# Patient Record
Sex: Female | Born: 1935 | Race: White | Hispanic: No | Smoking: Former smoker
Health system: Southern US, Community
[De-identification: ages and names within clinical notes are randomized; demographics above are authoritative.]

## PROBLEM LIST (undated history)

## (undated) DIAGNOSIS — I350 Nonrheumatic aortic (valve) stenosis: Secondary | ICD-10-CM

## (undated) DIAGNOSIS — I442 Atrioventricular block, complete: Secondary | ICD-10-CM

## (undated) DIAGNOSIS — J40 Bronchitis, not specified as acute or chronic: Secondary | ICD-10-CM

## (undated) DIAGNOSIS — E785 Hyperlipidemia, unspecified: Secondary | ICD-10-CM

## (undated) DIAGNOSIS — I10 Essential (primary) hypertension: Secondary | ICD-10-CM

## (undated) DIAGNOSIS — I2510 Atherosclerotic heart disease of native coronary artery without angina pectoris: Secondary | ICD-10-CM

## (undated) HISTORY — DX: Atherosclerotic heart disease of native coronary artery without angina pectoris: I25.10

## (undated) HISTORY — DX: Atrioventricular block, complete: I44.2

## (undated) HISTORY — DX: Hyperlipidemia, unspecified: E78.5

## (undated) HISTORY — DX: Nonrheumatic aortic (valve) stenosis: I35.0

---

## 2000-01-06 ENCOUNTER — Inpatient Hospital Stay (HOSPITAL_COMMUNITY)
Admission: RE | Admit: 2000-01-06 | Discharge: 2000-01-14 | Payer: Self-pay | Admitting: Physical Medicine & Rehabilitation

## 2003-06-09 ENCOUNTER — Encounter: Payer: Self-pay | Admitting: Orthopedic Surgery

## 2003-12-30 ENCOUNTER — Inpatient Hospital Stay (HOSPITAL_COMMUNITY): Admission: EM | Admit: 2003-12-30 | Discharge: 2004-01-21 | Payer: Self-pay | Admitting: Emergency Medicine

## 2004-01-05 ENCOUNTER — Encounter (INDEPENDENT_AMBULATORY_CARE_PROVIDER_SITE_OTHER): Payer: Self-pay | Admitting: Specialist

## 2004-01-05 HISTORY — PX: CORONARY ARTERY BYPASS GRAFT: SHX141

## 2004-01-05 HISTORY — PX: AORTIC VALVE REPLACEMENT: SHX41

## 2004-01-08 ENCOUNTER — Encounter: Payer: Self-pay | Admitting: Thoracic Surgery (Cardiothoracic Vascular Surgery)

## 2004-01-11 HISTORY — PX: PERMANENT PACEMAKER INSERTION: SHX6023

## 2004-01-16 HISTORY — PX: STERNAL WOUND DEBRIDEMENT: SHX1058

## 2004-01-31 ENCOUNTER — Encounter
Admission: RE | Admit: 2004-01-31 | Discharge: 2004-01-31 | Payer: Self-pay | Admitting: Thoracic Surgery (Cardiothoracic Vascular Surgery)

## 2004-03-04 ENCOUNTER — Encounter
Admission: RE | Admit: 2004-03-04 | Discharge: 2004-03-04 | Payer: Self-pay | Admitting: Thoracic Surgery (Cardiothoracic Vascular Surgery)

## 2004-06-17 ENCOUNTER — Ambulatory Visit: Payer: Self-pay | Admitting: Orthopedic Surgery

## 2004-08-12 ENCOUNTER — Ambulatory Visit: Payer: Self-pay | Admitting: Internal Medicine

## 2004-08-16 ENCOUNTER — Inpatient Hospital Stay (HOSPITAL_COMMUNITY): Admission: EM | Admit: 2004-08-16 | Discharge: 2004-08-20 | Payer: Self-pay | Admitting: Emergency Medicine

## 2004-08-16 ENCOUNTER — Ambulatory Visit (HOSPITAL_COMMUNITY): Admission: RE | Admit: 2004-08-16 | Discharge: 2004-08-16 | Payer: Self-pay | Admitting: Pulmonary Disease

## 2004-09-09 ENCOUNTER — Encounter
Admission: RE | Admit: 2004-09-09 | Discharge: 2004-09-09 | Payer: Self-pay | Admitting: Thoracic Surgery (Cardiothoracic Vascular Surgery)

## 2005-04-29 ENCOUNTER — Ambulatory Visit (HOSPITAL_COMMUNITY): Admission: RE | Admit: 2005-04-29 | Discharge: 2005-04-29 | Payer: Self-pay | Admitting: *Deleted

## 2005-04-29 ENCOUNTER — Ambulatory Visit (HOSPITAL_COMMUNITY): Admission: RE | Admit: 2005-04-29 | Discharge: 2005-04-29 | Payer: Self-pay | Admitting: Pulmonary Disease

## 2006-09-24 ENCOUNTER — Ambulatory Visit (HOSPITAL_COMMUNITY): Admission: RE | Admit: 2006-09-24 | Discharge: 2006-09-24 | Payer: Self-pay | Admitting: Pulmonary Disease

## 2007-04-05 ENCOUNTER — Ambulatory Visit (HOSPITAL_COMMUNITY): Admission: RE | Admit: 2007-04-05 | Discharge: 2007-04-05 | Payer: Self-pay | Admitting: Cardiovascular Disease

## 2007-04-09 ENCOUNTER — Ambulatory Visit (HOSPITAL_COMMUNITY): Admission: RE | Admit: 2007-04-09 | Discharge: 2007-04-09 | Payer: Self-pay | Admitting: *Deleted

## 2007-08-03 ENCOUNTER — Encounter: Payer: Self-pay | Admitting: Orthopedic Surgery

## 2007-08-03 ENCOUNTER — Ambulatory Visit (HOSPITAL_COMMUNITY): Admission: RE | Admit: 2007-08-03 | Discharge: 2007-08-03 | Payer: Self-pay | Admitting: Pulmonary Disease

## 2008-03-31 ENCOUNTER — Ambulatory Visit (HOSPITAL_COMMUNITY): Admission: RE | Admit: 2008-03-31 | Discharge: 2008-03-31 | Payer: Self-pay | Admitting: Cardiovascular Disease

## 2008-08-28 ENCOUNTER — Ambulatory Visit (HOSPITAL_COMMUNITY): Admission: RE | Admit: 2008-08-28 | Discharge: 2008-08-28 | Payer: Self-pay | Admitting: Pulmonary Disease

## 2008-08-28 ENCOUNTER — Ambulatory Visit: Payer: Self-pay | Admitting: Orthopedic Surgery

## 2008-08-28 DIAGNOSIS — S62109A Fracture of unspecified carpal bone, unspecified wrist, initial encounter for closed fracture: Secondary | ICD-10-CM | POA: Insufficient documentation

## 2008-08-28 DIAGNOSIS — S52539A Colles' fracture of unspecified radius, initial encounter for closed fracture: Secondary | ICD-10-CM | POA: Insufficient documentation

## 2008-08-30 ENCOUNTER — Encounter: Payer: Self-pay | Admitting: Orthopedic Surgery

## 2008-08-30 ENCOUNTER — Encounter (HOSPITAL_COMMUNITY): Admission: RE | Admit: 2008-08-30 | Discharge: 2008-08-30 | Payer: Self-pay | Admitting: Orthopedic Surgery

## 2008-09-06 ENCOUNTER — Encounter: Payer: Self-pay | Admitting: Orthopedic Surgery

## 2008-09-28 ENCOUNTER — Ambulatory Visit: Payer: Self-pay | Admitting: Orthopedic Surgery

## 2008-10-04 ENCOUNTER — Encounter: Payer: Self-pay | Admitting: Orthopedic Surgery

## 2008-10-26 ENCOUNTER — Ambulatory Visit: Payer: Self-pay | Admitting: Orthopedic Surgery

## 2008-11-15 ENCOUNTER — Ambulatory Visit: Payer: Self-pay | Admitting: Orthopedic Surgery

## 2008-12-06 ENCOUNTER — Ambulatory Visit: Payer: Self-pay | Admitting: Orthopedic Surgery

## 2009-07-11 ENCOUNTER — Ambulatory Visit (HOSPITAL_COMMUNITY): Admission: RE | Admit: 2009-07-11 | Discharge: 2009-07-11 | Payer: Self-pay | Admitting: Pulmonary Disease

## 2009-09-25 ENCOUNTER — Encounter (HOSPITAL_COMMUNITY): Admission: RE | Admit: 2009-09-25 | Discharge: 2009-10-25 | Payer: Self-pay | Admitting: Pulmonary Disease

## 2009-10-15 ENCOUNTER — Ambulatory Visit (HOSPITAL_COMMUNITY): Admission: RE | Admit: 2009-10-15 | Discharge: 2009-10-15 | Payer: Self-pay | Admitting: Cardiovascular Disease

## 2010-01-18 ENCOUNTER — Encounter: Payer: Self-pay | Admitting: Orthopedic Surgery

## 2010-06-16 ENCOUNTER — Encounter: Payer: Self-pay | Admitting: Cardiovascular Disease

## 2010-06-16 ENCOUNTER — Encounter: Payer: Self-pay | Admitting: Otolaryngology

## 2010-06-28 NOTE — Letter (Signed)
Summary: Medical record request [employer] Information  Medical record request [employer] Information   Imported By: Cammie Sickle 01/18/2010 09:51:26  _____________________________________________________________________  External Attachment:    Type:   Image     Comment:   External Document

## 2010-09-26 ENCOUNTER — Other Ambulatory Visit (HOSPITAL_COMMUNITY): Payer: Self-pay | Admitting: Cardiovascular Disease

## 2010-09-26 DIAGNOSIS — I719 Aortic aneurysm of unspecified site, without rupture: Secondary | ICD-10-CM

## 2010-10-11 NOTE — Discharge Summary (Signed)
NAMEAMIAYA, MCNEELEY                              ACCOUNT NO.:  [id number]   MEDICAL RECORD NO.:  [id number]                   PATIENT TYPE:  INP   LOCATION:  2019                                 FACILITY:  [facility]   PHYSICIAN:  Salvatore Decent. Cornelius Moras, M.D.              DATE OF BIRTH:  04-13-1932   DATE OF ADMISSION:  DATE OF DISCHARGE:  01/22/2004                                 DISCHARGE SUMMARY   ADMITTING DIAGNOSES:  Critical aortic valve stenosis.   PAST MEDICAL HISTORY:  1. Hypertension.  2. Gastroesophageal reflux disease.  3. Osteoporosis.  4. Degenerative joint disease.  5. Arthritis.   SURGICAL HISTORY:  1. Right total hip replacement.  2. Right total knee replacement.  3. Repair of fracture and displaced right clavicle many years ago.   ALLERGIES:  None.   DISCHARGE DIAGNOSES:  1. Critical aortic stenosis and single-vessel coronary artery disease,     status post coronary artery bypass graft and aortic-valve replacement.  2. Left bundle branch block with postoperative persistent complete heart     block, status post permanent pacemaker insertion.  3. Sternal wound dehiscence, status post sternal debridement ad rewiring.  4. Generalized deconditioning, improving.   BRIEF HISTORY:  Ms. Lisle is a 77 year old female.  She was physically active  and functionally independent until a few months prior to this admission.  She had intermittent mild problems with congestion, but then she developed  intermittent symptoms of atypical chest pain.  Her symptoms progressed, and  she ultimately presented to the emergency department at [facility]  on December 30, 2003.  She was admitted for presumed evaluation of possible  angina with atypical chest pain.  On exam, she was noted to have congestive  heart failure.  Cardiology consultation was obtained on August 8 by Dr.  Domingo Sep of [facility].  On physical  examination, the patient was noted to have a  prominent murmur suggestive of  aortic stenosis.  Brain natruretic peptide level was severely elevated at  greater than 1200.  She had ruled out for acute myocardial infarction.  A 2-  D echocardiogram performed at [facility] revealed critical aortic  valve stenosis.  Ejection fraction was estimated to be 35-40%.  Ms. Torpey was  subsequently transferred to [facility]  under the care of  [facility].   HOSPITAL COURSE:  On January 03, 2004, Ms. Sandusky was transferred to [facility] under the care of Dr. Domingo Sep.  She underwent right and left heart  cardiac catheterized by Dr. Tresa Endo.  Findings included critical aortic-valve  stenosis with a mean transvalvular gradient of 80 mmHg and an estimated  aortic valve area of 0.3 sq. cm, mild pulmonary hypertension.  She was also  noted to have 50% stenosis of the mid LAD coronary artery.  Cardiac surgery  consultation was requested,  and she was evaluated later in the day by Dr.  Purcell Nails.  After examination of the patient and review of all  available records, Dr. Cornelius Moras recommended proceeding with aortic valve  replacement and single-vessel coronary artery bypass grafting.  The  procedures, the risks and benefits were all discussed with Ms. Twiggs and her  family, and Ms. Nedra Hai agreed to proceed with surgery.  A discussion included  use of mechanical versus bioprosthetic prosthesis for her aortic valve.  After considerable discussion, Ms. Sardo specifically requested the use of a  tissue valve so that she would not require long-term anticoagulation with  Coumadin.   Preoperative arterial evaluation included a carotid Duplex exam that  revealed no significant carotid artery disease.  Her lower-extremity  evaluation ABI's are greater than 1.0 bilaterally.   On January 05, 2004, Ms. Thurow underwent the following surgical procedures with  Dr. Salvatore Decent. Owen:  1. Aortic valve replacement with a 19 mm  Carpentier-Edwards pericardial     prosthesis.  2. Coronary artery bypass grafting x1, left internal mammary artery was     grafted to the left anterior descending artery.  Intraoperative findings     were notable for a bicuspid, native aortic valve with critical aortic     stenosis.  The maximum diameter of the ascending aorta was 3.7 cm.  She     had moderate-to-severe LV function.  Also had a very-large left     diaphragmatic hernia which was not repaired.  Also notes that she had a     transient episode of third-degree AV block immediately prior to surgery.     Ms. Druck tolerated these procedures reasonably well, transferring in     stable condition to the SICU.  She remained hemodynamically stable in the     immediate postoperative period.  She was extubated several hours after     arrival in the intensive care unit.  She awoke from anesthesia     neurologically intact.  She did require AV pacing postoperatively due to     her persistent heart block.  Permanent pacemaker insertion was     anticipated.  Ms. Leisey was ready for transfer to Unit 2000 on August 15.     Her overall recovery was slow, but steady.  Plans were made for a     permanent pacemaker insertion, and she was scheduled for January 11, 2004.   On that day, Dr. Pearletha Furl. Alanda Amass implanted a permanent DDR pacesetter,  Identity XL DR [address] Generator, Model #5376, [employer]  Serial [phone] with steroid eluting tined atrial J passive fixation and  steroid eluting tined ventricular electrode, both bipolar IS-1 connectors.  She tolerated this procedure well, transferring back to her room in stable  condition.   Over the next several days, Ms. Ohagan continued to make slow but steady  progress in her recovery.  She did continue to have some volume overload  treated with aggressive pulmonary toilet as well as some Lasix.  Case  management had been following her progress, and arrangements were made for Home  Health registered nurse, physical therapy and home aid.  She did  continue to require O2 supplementation as her saturations decreased into the  mid 80's with ambulation.   On August 24, on morning rounds, examination revealed her sternum with  marked instability.  There were audible and palpable clicks with each  breath.  There was no erythema or drainage noted.  Chest x-ray  was taken and  revealed that her sternal wires may have pulled through the bone at some  point.  A CT scan was performed and confirmed sternal fracture in multiple  locations and complete non-union, plus some small, associated pockets of  fluid.  Dr. Cornelius Moras reviewed the chest x-ray and the CT scan and discussed  options for proceeding with Ms. Rennert and her family.  He recommended sternal  rewiring versus continued observation.  Ms. Cressler elected to proceed with  sternal rewiring.   On August 23, Ms. Cherney underwent the following surgical procedure with Dr.  Cornelius Moras:  Sternal debridement and rewiring.  Findings at surgery are notable  for multiple fractures on each side of the sternal, body and manubrium.  There was no sign of infection.  She tolerated the procedure well,  transferring back to the SICU in stable condition.  She was extubated  immediately following surgery and awoke from anesthesia neurologically  intact.  She remained hemodynamically stable in the immediate postoperative  period.  She was returned to the SICU immediately postoperatively, and was  ready for discharge back to Unit 2000 on August 24.   Overall, Ms. Morash is continuing to make progress following her surgeries.  Her chest tubes were removed on August 25.  On August 26, Ms. Charlet seems to  be making progress.  Her incision is healing.  Her chest x-ray is improving.  She is still requiring O2 via nasal cannula.  She continues to require some  diuresis, and a pulmonary toilet.  If she continues to progress as  anticipated, she will be ready for discharge  home by August 29th.  Sutures  will stay in place until she is seen back in the office by Dr. Cornelius Moras.   CONDITION ON DISCHARGE:  Improved.   DISCHARGE MEDICATIONS:  1. Aspirin 325 mg daily.  2. Lopressor 50 mg b.i.d.  3. Altace 2.5 mg daily.  4. Aciphex 20 mg daily.  5. Iron complex 150 mg b.i.d.  6. Folic acid 2 mg daily.  7. Lasix 40 mg daily for the next five days.  8. Potassium chloride 20 mEq daily for the next five days.  9. Os-Cal 500 mg plus D t.i.d.  10.      For pain management, she may have Tylox one to two p.o. q.4-6h     p.r.n.   DISCHARGE ACTIVITIES:  1. She has been asked to refrain from any driving or heavy lifting, pushing     or pulling.  2. Also instructed to continue breathing exercises and daily walking.  3. She is to work with Home Health physical therapist to regain her     strength.   DISCHARGE DIET:  Diet should continue to be carbohydrate-modified, medium  calorie diet.  WOUND CARE:  She may shower.  If her incisions show any sign of infection,  she is to call Dr. Orvan July office.   FOLLOWUP:  1. She is to call for an appointment with her cardiologist, Dr. Domingo Sep, in     approximately two weeks.  She has been given the phone number for that     office.  2. Dr. Cornelius Moras would like to see her back at the [facility] office on Wednesday,     September 7, at 4 p.m.  She will be asked to have a chest x-ray q. dc on     September 7 at 3 p.m.  She will bring that chest x-ray to Dr. Orvan July     office  for him to review.      Toribio Harbour, N.P.                  Salvatore Decent. Cornelius Moras, M.D.    CTK/MEDQ  D:  01/19/2004  T:  01/21/2004  Job:  [id number]   cc:   Salvatore Decent. Cornelius Moras, M.D.  [address]  [place]  [place] [address]   Dani Gobble, MD  Fax: [phone]   Oneal Deputy. Juanetta Gosling, M.D.  [address]  [place]  [place] [address]  Fax: [phone]

## 2010-10-11 NOTE — Group Therapy Note (Signed)
NAMEELISE, KNOBLOCH                    ACCOUNT NO.:  [id number]   MEDICAL RECORD NO.:  [id number]          PATIENT TYPE:  INP   LOCATION:  A329                          FACILITY:  [facility]   PHYSICIAN:  Edward L. Juanetta Gosling, M.D.DATE OF BIRTH:  1931-12-18   DATE OF PROCEDURE:  08/19/2004  DATE OF DISCHARGE:                                   PROGRESS NOTE   PROBLEM LIST:  1.  Hyponatremia.  2.  Hypertension.  3.  Status post valve replacement.  4.  Gastroesophageal reflux disease.   SUBJECTIVE:  Ms. Belding says she is feeling better.  Her blood pressure was up  last night.  I have restarted her on her antihypertensives.  She was also  very anxious worried about having an EGD done.   OBJECTIVE:  VITAL SIGNS:  Temperature 98.1, pulse 70, respirations 18, blood  pressure 184/94.  CHEST:  Clear.  HEART:  Regular.   LABORATORY DATA AND X-RAY FINDINGS:  Her lab work today shows a white count  of 9600, hemoglobin 13.7, platelets 215.  Sodium 128, potassium 3.3.  After  she has had her EGD, I will replace her potassium.  C. difficile is  negative.   ASSESSMENT:  She seems to be doing well.   PLAN:  EGD today.  She will probably be able to go home tomorrow.      ELH/MEDQ  D:  08/19/2004  T:  08/19/2004  Job:  [id number]

## 2010-10-11 NOTE — Discharge Summary (Signed)
[facility]  Patient:    [name], [name]                           MRN: [id number] Adm. Date:  [id number] Disc. Date: [id number] Attending:  Herold Harms Dictator:   Mcarthur Rossetti. Angiulli, P.A.                           Discharge Summary  DISCHARGE DIAGNOSES: 1. Right total knee arthroplasty on December 31, 1999. 2. Postoperative anemia. 3. Hyponatremia and hypokalemia, resolved. 4. History of right total hip replacement in 1998. 5. History of dislocated clavicle fracture.  HISTORY OF PRESENT ILLNESS:  A 77 year old female with history of a right total hip arthroplasty in 1998, of which she did receive inpatient rehabilitation services for who now presented with chronic progressive right knee pain, x-rays with advanced degenerative joint disease.  No relief with conservative care.  Underwent a right total knee arthroplasty on December 31, 1999, placed on subcutaneous Lovenox for deep venous thrombosis prophylaxis, non-weightbearing.  Postoperative anemia and transfused.  No chest pain or shortness of breath.  Noted hypotension secondary to narcotics which were adjusted.  Her hydrochlorothiazide was discontinued.  Hyponatremia 123 improved to 133, potassium 3.2, placed on supplement.  Ambulating 90 feet with standby assistance.  Latest hemoglobin 11.5, hematocrit 34.4.  Chest x-ray no active disease.  Admitted for comprehensive rehab program.  PAST MEDICAL HISTORY:  See discharge diagnoses.  PAST SURGICAL HISTORY: 1. Dislocated collar bone in 1975. 2. Right total hip arthroplasty.  PRIMARY CARE PHYSICIAN:  Kari Baars, M.D. of [place].  ORTHOPEDIST:  Dr. Romeo Apple.  ALLERGIES:  No known drug allergies.  MEDICATIONS PRIOR TO ADMISSION: 1. Zestoretic daily. 2. Calcitonin nasal spray daily.  SOCIAL HISTORY:  Lives with husband in [place].  Independent prior to admission. Retired.  One level home, three steps to entry.  Plans to stay with  her daughter in [place] on discharge.  That is a two level home with three steps to entry, sleeper sofa in downstairs.  Daughter with intermittent follow up.  HOSPITAL COURSE:  The patient did well while in rehabilitation services with therapies initiated on a b.i.d. basis.  The following issues were followed during patients rehab course:  Pertaining to Ms. Lees right total knee arthroplasty, it remained stable, surgical site healing nicely, staples had been removed, no signs of infection. She was touchdown weightbearing as advised per orthopedic services.  Her postoperative anemia was stable with latest hemoglobin of 10.7, hematocrit 33.6.  Her hyponatremia hypokalemia had improved.  Latest sodium 133 and potassium 10.5.  Blood pressures remained monitored on hydrochlorothiazide. Her lisinopril had been held since January 07, 2000, due to some hypotension. This was resumed at time of discharge.  She had no bowel or bladder disturbances.  Overall, for her functional mobility she was ambulating extended distances with a walker, household distances, non-weightbearing, needing very little assistance for activities of daily living.  She would receive home health therapies.  Latest labs showed a hemoglobin 10.7, hematocrit 33.6.  Sodium 133, BUN 10, creatinine 0.5.  She was discharged to home.  DISCHARGE MEDICATIONS: 1. OxyContin CR 10 mg q.12h. x 1 week. 2. Calcitonin nasal spray 1 puff daily. 3. Hydrochlorothiazide 12.5 mg daily. 4. Lisinopril 20 mg daily. 5. The patient was on Lovenox for deep venous thrombosis prophylaxis which was    discontinued at time of discharge. 6.  Oxycodone immediate release as needed pain.  ACTIVITY:  Non-weightbearing with walker.  DIET:  Regular.  WOUND CARE:  Cleanse incision daily with warm soap and water.  FOLLOWUP:  Dr. Romeo Apple orthopedic services, Dr. Shaune Pollack medical management. DD:  01/13/00 TD:  01/14/00 Job: [id number]

## 2010-10-11 NOTE — Consult Note (Signed)
[name], [name]                    ACCOUNT NO.:  [id number]   MEDICAL RECORD NO.:  [id number]          PATIENT TYPE:  INP   LOCATION:  A329                          FACILITY:  [facility]   PHYSICIAN:  Lionel December, M.D.    DATE OF BIRTH:  [birth year]   DATE OF CONSULTATION:  08/18/2004  DATE OF DISCHARGE:                                   CONSULTATION   REASON FOR CONSULTATION:  Chronic gastroesophageal reflux disease which is  poorly controlled.   HISTORY OF PRESENT ILLNESS:  [name] is a 77 year old Caucasian female who was  admitted to Dr. Juanetta Gosling' service yesterday with profound hyponatremia.  The  patient was recently treated for URI with Levaquin.  She presented to Dr.  Juanetta Gosling on August 16, 2004 with extreme weakness.  She also was complaining  of bloating and retrosternal burning and had a few loose stools.  She had  lab studies by him. Her WBC was 13.7.  Hemoglobin 14.2 hematocrit 43.5.  Platelet count was 248,000 and segs were 85%.  Serum sodium was 109,  potassium 4, chloride 71, glucose 144.  BUN 3, creatinine 0.6, calcium 8.9.  With this profound hyponatremia, the patient was advised to be hospitalized.  She has been treated with sodium chloride.  She has been on IV fluids with  normal saline and her hyponatremia has corrected to serum sodium of 129  today.  She is feeling some better.   The patient has had heartburn for seven years according to her daughter.  Her symptoms have not been well controlled.  According to Dr. Juanetta Gosling, she  has been on double dose PPI in the past.  She has frequent heartburn and  regurgitation particular when she is supine.  She has intermittent  hoarseness and cough.  She denies nausea, vomiting, dysphagia, melena, or  rectal bleeding.  For the last couple of days her stools have been normal.  She also denies abdominal pain.  She had cardiac surgery in August 2005 and  she just has not bounced back from that surgery.  She feels fatigued, weak  and  has not been able to do much physical activity.  She joined cardiac  rehab but dropped out quickly. She states she does walk some in her house.  She has never undergone EGD in the past.  While she is in the hospital, Dr.  Juanetta Gosling felt that she could undergo GI evaluation.   MEDICATIONS:  1.  Lovenox 40 mg subcutaneously q.12h.  2.  Flonase each nostril daily.  3.  Protonix 40 mg p.o. q.a.m.  4.  Rocephin 1 gram IV daily.  5.  She is on normal saline at a rate of 75 mg/hour.  6.  P.r.n. medications include Imodium, Zofran, and Tylenol.   At home she has been on aspirin, Lopressor, Altace, Aciphex, and folic acid.   PAST MEDICAL HISTORY:  1.  Hypertension.  2.  Osteoporosis.  She has been on Fosamax in the past, but presently on no      medications.  She says she does take Tums for heartburn  and to get an      extra dose of calcium.  3.  She had AVR and CABG in August 2005.  She had wound dehiscence or some      problem and had to have redo closure.  4.  She has had chronic GERD for 10 years or longer.  5.  She has history of osteoarthrosis.  6.  She has had right knee replacement as well as right hip replacement by      Dr. Romeo Apple a few years ago.  7.  She has had tonsillectomy.   ALLERGIES:  None known.   FAMILY HISTORY:  Significant for diabetes mellitus in one sister.   SOCIAL HISTORY:  She is a widow.  She is retired.  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  She quit cigarette smoking 30 years ago after having smoked  for five years.  She does not drink alcohol.  She lives alone.  She has five  children.   PHYSICAL EXAMINATION:  GENERAL:  Pleasant, well-developed, well-nourished  Caucasian female who was in no acute distress, 145 pounds.  She is 63 inches  tall.  VITAL SIGNS:  Pulse 86 per minute, blood pressure 150/79, respirations 18,  temperature 97.  HEENT: Conjunctivae pink. Sclerae nonicteric.  Oropharyngeal mucosa is  normal.  She has upper and lower dentures in  place.  NECK:  No neck masses or thyromegaly noted.  CARDIAC:  Regular rhythm, normal S1 and S2.  She also has a grade 2/6  systolic ejection murmur best heard in the aortic area.  LUNGS:  She has a few fine rales at the right base.  ABDOMEN:  Symmetrical.  Bowel sounds are normal.  Palpation reveals soft  abdomen without tenderness, organomegaly or masses.  RECTAL:  Deferred.  EXTREMITIES:  She does not have peripheral edema or clubbing.   LABORATORY DATA:  Preadmission labs from August 16, 2004 as above.  Chemistry  panel from August 17, 2004 showed sodium 113, potassium 4, chloride 81, CO2  27, glucose 79, BUN 3, bilirubin 0.8, AP 57, AST 22, ALT 19, total protein  5.1 with albumin 3.2, calcium 7.8.  Her serum sodium was 129 today.   ASSESSMENT:  1.  [name] is a 77 year old Caucasian female who is undergoing therapy for      hyponatremia which is unexplained.  2.  She has chronic gastroesophageal reflux disease with poorly controlled      symptoms but on proton pump inhibitor.  Her upper gastrointestinal tract needs to be evaluated to make sure she does  not have a large hernia or varus esophagus.  1.  She is at average risk for colorectal carcinoma.  She has never been      screened.  This was discussed with the patient and she may consider      having colonoscopy for screening purposes at her later date when      electrolyte issues have been resolved.   RECOMMENDATIONS:  1.  Diagnostic esophagogastroduodenoscopy to be performed in a.m.  She will      stay on single-dose PPI for now.  She will be SBE prophylaxis for this procedure.  1.  We will also check TSH and fasting cortisol levels   I would like to thank Dr. Juanetta Gosling for the opportunity to participate in the  care of this nice lady.      NR/MEDQ  D:  08/18/2004  T:  08/19/2004  Job:  [id number]

## 2010-10-11 NOTE — Procedures (Signed)
[name], [name]                              ACCOUNT NO.:  [id number]   MEDICAL RECORD NO.:  [id number]                   PATIENT TYPE:  INP   LOCATION:  A313                                 FACILITY:  [facility]   PHYSICIAN:  Dani Gobble, MD                    DATE OF BIRTH:  [birth year]   DATE OF PROCEDURE:  01/02/2004  DATE OF DISCHARGE:                                  ECHOCARDIOGRAM   REFERRING PHYSICIAN:  Dr. Juanetta Gosling and Dr. Domingo Sep.   INDICATIONS:  [name] is a 77 year old female with history of hypertension  who is admitted with chest pain and shortness of breath. She was found to  have a systolic murmur and was referred for echocardiogram today to evaluate  for the possibility of aortic stenosis. Technical quality of the study is  somewhat limited secondary to patient's body habitus and poor acoustic  windows.   The aorta is within normal limits at 3.1 cm.   The left atrium is mildly dilated at 4.2 cm. No obvious clots or masses were  appreciated, and the patient appeared to be in sinus rhythm during this  procedure. The intraventricular septum and posterior wall were moderately  thickened at 1.8 cm and 1.6 cm, respectively.   The aortic valve was heavily calcified with severely diminished leaflet  excursion. Peak velocity was 5.5 m per second corresponding to a peak  gradient at 121 mmHg and a mean gradient of 84 mgHg consistent with severe  aortic stenosis. Mild aortic insufficiency.   The mitral valve appeared structurally normal with mild mitral annular  calcification. Mild mitral regurgitation was noted. No mitral valve prolapse  was noted. Doppler interrogation of the mitral valve was within normal  limits.   The pulmonic valve was not well visualized.   The tricuspid valve appeared grossly structurally normal with tricuspid  regurgitation noted.   The left ventricle was normal in size with the LVIDD measured at 4.0 cm and  the LVISD measured at 3.1 cm. There  was mild global hypokinesis with a  severely hypokinetic to akinetic anterior septal wall. The overall ejection  fraction was diminished and estimated at 35 to 40%. The presence of  diastolic dysfunction was inferred from pulse wave Doppler across the mitral  valve. The right atrium and right ventricle were normal in size, and the  right ventricular systolic function appeared to be preserved. The intra-  atrial septum exhibited left to right bowing suggestive of elevated left  ventricular end diastolic pressure.   IMPRESSION:  1. Mild left atrial enlargement.  2. Moderate concentric left ventricular hypertrophy.  3. Severe aortic stenosis.  4. Mild mitral annular calcification.  5. Mild mitral regurgitation.  6. Trivial tricuspid regurgitation.  7. Intra-atrial septum exhibits left to right bowing suggestive of elevated     left ventricular end diastolic pressure.  8. Mild aortic insufficiency  is noted.  9. Normal left ventricular size with decreased ejection fraction estimated     at 35 to 40%. There is mild global hypokinesis with additional severely     hypokinetic to akinetic anterior septal wall.  10.      The presence of diastolic dysfunction is inferred from pulse wave     Doppler across the mitral valve.      ___________________________________________                                            Dani Gobble, MD   AB/MEDQ  D:  01/02/2004  T:  01/02/2004  Job:  [id number]

## 2010-10-11 NOTE — Op Note (Signed)
[name], May                              ACCOUNT NO.:  [id number]   MEDICAL RECORD NO.:  [id number]                   PATIENT TYPE:  INP   LOCATION:  2311                                 FACILITY:  [facility]   PHYSICIAN:  Judie Petit, M.D.              DATE OF BIRTH:  1931-07-21   DATE OF PROCEDURE:  01/05/2004  DATE OF DISCHARGE:                                 OPERATIVE REPORT   INDICATIONS FOR PROCEDURE:  Ms. Domek is a 77 year old female who presents  today for coronary artery bypass grafting and aortic valve replacement by  Dr. Cornelius Moras. A transesophageal echocardiogram will be utilized for evaluation  of cardiac structure and function.   The morning of surgery the patient was brought to the holding area where a  radial artery and pulmonary artery lines were placed under local anesthesia  with sedation. The patient was then taken to the operating room for routine  induction of anesthesia.  The trachea was intubated. Following intubation of  the trachea, the TE pole was passed oropharyngeally into the and withdrawn  for imaging of the cardiac structures. The TE probe was placed in a sleeve  and lubricated.   CARDIOPULMONARY BYPASS, TRANSESOPHAGEAL ECHOCARDIOGRAM:  Left ventricle:  The left ventricle was moderately concentrically thickened. There was mild  to moderate depression of left ventricular function, with hypokinesis of the  anteroseptal area.  Aortic valve: The aortic valve was heavily calcified with significant  decrease in leaflet excursion. There is trace to mild aortic insufficiency  by Doppler examination.  Mitral valve: The valve appeared to function appropriately. There was 1+  mitral regurgitant flow on Doppler examination. There was no reversal of  flow by pulse rate examination.  Left atrium: The left atrium was dilated.  The atrial appendage was clear of  any masses. The interapical septum appeared intact.  Right ventricle: Pulse within normal limits. The  tricuspid valve appeared to  function appropriately. There was tricuspid regurgitant flow on examination.   The patient was placed on cardiopulmonary bypass, coronary artery bypass  grafting, and aortic valve replacement was carried out. De-airing maneuvers  were subsequently carried out. The patient was re-warmed and separated from  coronary artery bypass graft with the initial attempt.   POST CABG TRANSESOPHAGEAL ECHOCARDIOGRAM, LIMITED EXAMINATION:  Left  ventricle--the chamber initially revealed evidence of low volume, but with  replacement of volume the contractile pattern improved.  Aortic valve--the aortic valve appeared to function appropriately. There did  not appear to be any obstruction to flow. There was no significant  regurgitant flow on Doppler examination.   The rest of the cardiac exam was as previously described. The patient was  returned to the cardiac intensive care unit in stable condition.  Judie Petit, M.D.    CE/MEDQ  D:  01/06/2004  T:  01/06/2004  Job:  [id number]

## 2010-10-11 NOTE — H&P (Signed)
NAMEJOSIE, [name]                              ACCOUNT NO.:  [id number]   MEDICAL RECORD NO.:  [id number]                   PATIENT TYPE:  INP   LOCATION:  A313                                 FACILITY:  [facility]   PHYSICIAN:  Melvyn Novas, MD        DATE OF BIRTH:  07-13-1931   DATE OF ADMISSION:  12/30/2003  DATE OF DISCHARGE:                                HISTORY & PHYSICAL   The patient is a 77 year old white female, widowed this year, patient of Dr.  Juanetta Gosling who was seen in the office one week ago and given Aciphex and also  an antibiotic for bronchitis and GERD like symptoms.  She, despite this  therapy listed previously, had increasing retrosternal burning radiating to  her neck and through to her back.  This was also associated with increasing  dyspnea on exertion over the previous week.  She specifically denies anginal  chest pain, orthopnea or PND, cough or sputum.  However, she is noted to  have a left bundle branch block pattern on EKG with no old EKG to compare it  to despite looking through her old records.  She is subsequently admitted  for GERD like symptoms with a question of an anginal component in the face  of a left bundle.  She will be placed on antiplatelet therapy, sublingual  nitroglycerin and a Cardiolite scan scheduled for Monday.   PAST MEDICAL HISTORY:  1. GERD.  2. History of hypertension, off current therapy.  3. Questionable reactive depression due to being widowed this year.   PAST SURGICAL HISTORY:  1. Right hip arthroplasty.  2. Right knee arthroplasty.  3. Fractured displaced right clavicle many years ago.   SOCIAL HISTORY:  She lives alone and has four children who are supportive.   CURRENT MEDICATIONS:  1. Aciphex 20 mg per day.  2. Phenergan 25 p.r.n.  3. Keflex 500 t.i.d.   PHYSICAL EXAMINATION:  VITAL SIGNS:  Blood pressure is 132/74; pulse is 91  and regular; respiratory rate is 18; 95% sat; temperature is 97.1.  EYES:   PERRLA.  Extraocular movements are intact.  Sclerae clear.  Conjunctivae pink.  NECK:  No jugulovenous distension.  No carotid bruits.  No thyromegaly or  thyroid bruits.  LUNGS:  Clear to A&P.  No rales, wheezes or rhonchi.  HEART:  Regular rhythm.  No murmurs, gallops, heaves, thrills or rubs.  No  S3, S4 is appreciable.  ABDOMEN:  Soft, nontender.  Bowel sounds normoactive.  No guarding, rebound,  no masses, no organomegaly.  EXTREMITIES:  No clubbing, cyanosis or edema.  NEUROLOGIC:  Cranial nerves II-XII are grossly intact.  The patient moves  all four extremities.   IMPRESSION:  1. Chest pain, gastroesophageal reflux disease like symptoms.  2. New left bundle branch block pattern per EKG.  3. History of hypertension; lipid status unknown.  4. Recently widowed.   PLAN:  Admit, get cardiac  enzymes daily times two, schedule Cardiolite scan  for Monday, aspirin free, antiplatelet therapy, sublingual nitroglycerin  p.r.n., continue Aciphex 20 mg per day and I will make further  recommendations as the data base expands.     ___________________________________________                                         Melvyn Novas, MD   RMD/MEDQ  D:  12/30/2003  T:  12/30/2003  Job:  [id number]

## 2010-10-11 NOTE — Discharge Summary (Signed)
NAMEDONJA, TIPPING                    ACCOUNT NO.:  [id number]   MEDICAL RECORD NO.:  [id number]          PATIENT TYPE:  INP   LOCATION:  A329                          FACILITY:  [facility]   PHYSICIAN:  Edward L. Juanetta Gosling, M.D.DATE OF BIRTH:  1932/04/10   DATE OF ADMISSION:  08/16/2004  DATE OF DISCHARGE:  03/28/2006LH                                 DISCHARGE SUMMARY   FINAL DISCHARGE DIAGNOSES:  1.  Hyponatremia.  2.  Gastroesophageal reflux disease and peptic ulcer disease.  3.  Hypertension.  4.  Status post aortic valve replacement.  5.  Osteoporosis.  6.  Degenerative joint disease.  7.  Coronary artery occlusive disease.   HISTORY OF PRESENT ILLNESS:  Ms. Latka is a 77 year old with complicated  medical history over the last six months or so.  She has had an aortic valve  replacement and coronary artery bypass grafting.  She had been doing fairly  well, making slow progress, and when she developed what seemed to be an  upper respiratory infection which is about a third or fourth of the last  several weeks.  She came to my office on the day of admission, had a chest x-  ray done which really was pretty much nonrevealing.  Had blood work done  which showed that she was intensely hyponatremic.   PHYSICAL EXAMINATION:  GENERAL APPEARANCE:  Well-developed, well-nourished  female in no acute distress but weak and tired.  VITAL SIGNS:  Temperature 97.2, pulse 74, respirations 20, blood pressure  161/93.  HEENT:  Pupils were reactive.  Nose and Throat clear.  NECK:  Supple without masses.  CHEST:  Fairly clear.  Midline sternotomy scar was seen.   LABORATORY DATA:  White count was 13,700, sodium 109.   ASSESSMENT:  At the time of admission, she had hyponatremia, probably  because she had had significant problems with reflux.  Had taken in fluids  at the expense of taking in solid food.  Sodium came up rather quickly.  She  was evaluated by the GI team, had an EGD which showed  gastritis,  esophagitis, a patch of what may be gastric mucosa in the esophagus and  evidence of an old ulcer.  She was treated with Protonix 40 mg b.i.d. and  has improved.  At the time of discharge, she is much improved, and she is  discharged home off of her previous Aciphex.   DISCHARGE MEDICATIONS:  1.  Protonix 40 mg b.i.d.  2.  Lopressor 50 mg b.i.d.  3.  Altace 10 mg daily.  4.  Folic acid 2 mg daily.  5.  Reglan 10 mg a.c. and h.s.   DISCHARGE INSTRUCTIONS:  She is going to liberalize her diet to have more  salt, and she will have home health services.      ELH/MEDQ  D:  08/20/2004  T:  08/20/2004  Job:  [id number]

## 2010-10-11 NOTE — Group Therapy Note (Signed)
NAMEDORENE, BRUNI                    ACCOUNT NO.:  [id number]   MEDICAL RECORD NO.:  [id number]          PATIENT TYPE:  INP   LOCATION:  A329                          FACILITY:  [facility]   PHYSICIAN:  Edward L. Juanetta Gosling, M.D.DATE OF BIRTH:  Dec 07, 1931   DATE OF PROCEDURE:  08/17/2004  DATE OF DISCHARGE:                                   PROGRESS NOTE   PROBLEM:  1.  Hyponatremia.  2.  Upper respiratory infection.   SUBJECTIVE:  Ms. Reiger says she is feeling better.  She has no new complaints  except for a headache.  Her reflux is better.   PHYSICAL EXAMINATION:  VITAL SIGNS:  Temperature is 97.9, pulse 69,  respirations 20, blood pressure 123/72.  CHEST:  Much clearer.  HEART:  Regular.   LABORATORY DATA:  Her sodium has come up, but only to 113, however, that is  in less than 24 hours.  Glucose is 79, BUN 3, creatinine 0.8.  Her liver  function tests are normal.  Albumin is 3.2.   ASSESSMENT:  She and I have discussed all of this at length, and I think the  biggest problem is that she is still not totally recovered from her previous  surgery for her heart, and I think she has had a significant weakness, etc.,  from that.      ELH/MEDQ  D:  08/17/2004  T:  08/18/2004  Job:  [id number]

## 2010-10-11 NOTE — Op Note (Signed)
[name], [name]                              ACCOUNT NO.:  [id number]   MEDICAL RECORD NO.:  [id number]                   PATIENT TYPE:  INP   LOCATION:  2304                                 FACILITY:  [facility]   PHYSICIAN:  Salvatore Decent. Cornelius Moras, M.D.              DATE OF BIRTH:  1932/02/15   DATE OF PROCEDURE:  01/16/2004  DATE OF DISCHARGE:                                 OPERATIVE REPORT   PREOPERATIVE DIAGNOSIS:  Sternal dehiscence.   POSTOPERATIVE DIAGNOSIS:  Sternal dehiscence.   PROCEDURE:  Excisional sternal debridement and sternal rewiring.   SURGEON:  Salvatore Decent. Cornelius Moras, M.D.   ASSISTANT:  Ms. Max Sane.   ANESTHESIA:  General.   BRIEF CLINICAL NOTE:  The patient is a 77 year old female who underwent  aortic valve replacement and coronary artery bypass grafting x1 on August  12.  Her initial postoperative course was notable for some persistent  complete heart block, which was also notably present transiently  preoperatively.  The patient otherwise slowly improved without significant  sequelae.  She ultimately underwent placement of a permanent pacemaker.  This was performed on August 18.  Chest x-ray performed on August 19  revealed that all of the sternal wires originally used to close her  sternotomy at the time of her original heart surgery appear to have moved or  pulled through.  Over the ensuing several days the patient developed  increased pain as well as obvious physical signs of sternal instability.  A  chest CT scan confirmed the presence of sternal dehiscence.  The patient  remained entirely afebrile with normal white blood count and no sign of  infection whatsoever on physical exam.   OPERATIVE CONSENT:  The patient and her daughter have been counseled  regarding potential risks and benefits associated with an attempt at sternal  rewiring.  With sternal dehiscence it is felt that the possibility of  delayed sternal healing would be essentially impossible  and without re-  operation, the patient would be left at the very least with an unstable  sternum and likelihood of chronic pain.  The possibility of increased risk  of infection is also concerning.  The relative risks of an attempt at re-  operation for sternal debridement and rewiring has also been reviewed.  The  patient and her daughter understand and accept all associated risks of  surgery, including but not limited to risk of death, stroke, myocardial  infarction, pneumonia, respiratory failure, bleeding requiring blood  transfusion, or late failure of the sternum to heal or possible development  of sternal wound infection.  All of their questions have been addressed.   OPERATIVE NOTE IN DETAIL:  The patient is brought to the operating room on  the above-mentioned date.  She is placed in a supine position on the  operating table.  Intravenous antibiotics are administered.  Pneumatic  sequential compression boots are placed  on both lower extremities to  decrease the risk of deep venous thrombosis.  General endotracheal  anesthesia is induced uneventfully.  A Foley catheter is placed.  The  patient's anterior chest and the abdomen are prepared and draped in a  sterile manner.   The patient's previous median sternotomy incision is reopened.  The skin  incision actually had been healing nicely, and there is no sign of any soft  tissue infection.  Once the fascia was reopened, there was obvious complete  dehiscence of the sternum.  There was some old bloody fluid in this region,  which was sent for Gram stain and culture and sensitivity.  On examination,  one can appreciate that all seven of the previous sternal wires have pulled  through the bone.  There were a total of four fractures on the left side and  three on the right.  All of the sternal wires were intact, and none of them  had fractured.  The patient was noted to have extremely severely brittle  bone with severe osteoporosis.   All of the old sternal wires were removed.  Sharp debridement is then performed to trim off any devascularized or  devitalized tissues and clean up the bone edges.  Similarly, the skin edges  were re-excised circumferentially.  The entire wound is now irrigated using  a pulse irrigation device, and a total of 3 L of irrigation is utilized.  Following this the wound is again irrigated using vancomycin-containing  solution.  Sternal rewiring is now performed using bilateral Robicsek weave.  These wires are placed each individually lateral to the lateral border of  the sternum and run up and down either side of the sternum circumferentially  over each intercostal space and through the manubrium.  Each of these wires  were then pulled tight and twisted down in an attempt to provide some  stability in the cephalad-caudal orientation.  Following completion of this,  manubrium and sternum itself is then reapproximated on either side using a  series of double-strength sternal wires.  Two single wires are placed  through the top of the manubrium and a series of figure-of-eight wires are  placed through the remainder of the sternum.  These wires are placed to  course just lateral to the previously-placed Robicsek weave wires on either  side of the sternum.  After all of the wires are placed, hemostasis is  ascertained and then the wires are secured to reapproximate the sternum.  There appears to be excellent stability under the circumstances once the  sternal closure is completed.  The wound is again irrigated with saline  solution containing vancomycin.  The deep fascia is mobilized on either side  to facilitate re-closure in the middle.  The patient is noted to have  extremely friable tissues, and there is significant concern that primary re-  closure of the fascia would likely fail.  Therefore, the deep fascia and soft tissues are reapproximated using interrupted vertical mattress #1  Prolene  suture placed full-thickness through the skin as retention sutures  with 14 French red rubber catheters on either side of the incision to  bolster the closure.  Once these retention sutures were in place, there  remained no tension on the deep fascia itself, and this is closed with a  running closure of #1 PDS suture.  The skin and soft tissues are then closed  using interrupted 3-0 vertical mattress Prolene sutures through-and-through  the skin.  Prior to closure, the mediastinum was  drained using two 32 French  chest tubes exited through separate stab incisions inferiorly.  The chest  tubes are fixed to closed suction drainage device, and dry sterile dressings  are applied.  The patient tolerated the procedure well and is transported to  the postanesthesia care unit intubated, sedated, and in stable condition.  There are no intraoperative complications.  Estimated blood loss for the  procedure was 150 mL.                                               Salvatore Decent. Cornelius Moras, M.D.    CHO/MEDQ  D:  01/16/2004  T:  01/17/2004  Job:  [id number]

## 2010-10-11 NOTE — Discharge Summary (Signed)
NAMESHALEKA, BRINES                              ACCOUNT NO.:  [id number]   MEDICAL RECORD NO.:  [id number]                   PATIENT TYPE:  INP   LOCATION:  A313                                 FACILITY:  [facility]   PHYSICIAN:  Edward L. Juanetta Gosling, M.D.             DATE OF BIRTH:  [birth year]   DATE OF ADMISSION:  12/30/2003  DATE OF DISCHARGE:  01/02/2004                                 DISCHARGE SUMMARY   Being transferred to [place].   FINAL DISCHARGE DIAGNOSES:  1. Congestive heart failure secondary to aortic stenosis.  2. Aortic stenosis.  3. Hypertension.  4. History of hip replacement.   HISTORY:  Ms. Tillison is a 77 year old who I saw in my office about a week prior  to admission with difficulty with cough and congestion.  She was mildly  short of breath at that time.  I treated her with antibiotics for a presumed  bronchitis, but she did not improve and eventually came to the emergency  room on the day of admission and was admitted from there.  At the time of  admission, she was complaining of increasing shortness of breath which was  worse than when I had seen her in my office, swelling of both legs, and  simply not feeling well.  Her exam showed that she had some jugular venous  distention, some rales in both bases, and a systolic murmur along the left  sternal border.  Chest x-ray showed cardiac enlargement and question of some  CHF.   HOSPITAL COURSE:  She was started on O2 and diuretics.  A consultation was  obtained with Dr. Domingo Sep.  She underwent an echocardiogram which showed  severe aortic stenosis and because of this it was felt that she needed left  and right heart catheterization.  She was transferred to [facility]  for this to be done.     ___________________________________________                                         Oneal Deputy. Juanetta Gosling, M.D.   Gwenlyn Found  D:  01/02/2004  T:  01/02/2004  Job:  [id number]

## 2010-10-11 NOTE — Group Therapy Note (Signed)
[name], [name]                              ACCOUNT NO.:  [id number]   MEDICAL RECORD NO.:  [id number]                   PATIENT TYPE:  INP   LOCATION:  A313                                 FACILITY:  [facility]   PHYSICIAN:  Edward L. Juanetta Gosling, M.D.             DATE OF BIRTH:  1931-09-28   DATE OF PROCEDURE:  DATE OF DISCHARGE:                                   PROGRESS NOTE   PROBLEM:  Shortness of breath.   SUBJECTIVE:  [name] says she is feeling better. She is not as short of  breath. She was able to sleep last night, and she was able to lie flat. Dr.  Roque Lias help is noted and appreciated.   PHYSICAL EXAMINATION:  VITAL SIGNS:  Temperature 97.5, pulse 70,  respirations 20, blood pressure 123/76.  CHEST:  Her chest is clear.  HEART:  Regular.  ABDOMEN:  Soft.  EXTREMITIES:  Showed no edema now. She still has a systolic murmur.   ASSESSMENT:  She seems better.   PLAN:  She was sent for an echocardiogram. She is still on the schedule for  a stress test. I am not certain that Dr. Domingo Sep actually intends her to  have that today, so I am going to go ahead and try to discuss with Dr.  Domingo Sep. If I cannot get a hold of Dr. Domingo Sep, I am going to go ahead and  cancel the stress test, at least for now.      ___________________________________________                                            Oneal Deputy. Juanetta Gosling, M.D.   Gwenlyn Found  D:  01/02/2004  T:  01/02/2004  Job:  [id number]

## 2010-10-11 NOTE — Op Note (Signed)
NAMEHARSHIKA, MAGO                              ACCOUNT NO.:  [id number]   MEDICAL RECORD NO.:  [id number]                   PATIENT TYPE:  INP   LOCATION:  2017                                 FACILITY:  [facility]   PHYSICIAN:  Richard A. Alanda Amass, M.D.          DATE OF BIRTH:  April 29, 1932   DATE OF PROCEDURE:  01/11/2004  DATE OF DISCHARGE:                                 OPERATIVE REPORT   PROCEDURE:  Implantation of permanent DDR Pacesetter Identity XLDR A-V  Universal pulse generator, model number 5376, [employer], SN:  [id number], with steroid eluding tined atrial J passive fixation and steroid  eluding tined ventricular electrode, both bipolar, IS1 connectors.   PHYSICIAN:  Richard A. Alanda Amass, M.D.   COMPLICATIONS:  None.   ESTIMATED BLOOD LOSS:  Approximately 30-40 mL.   ANESTHESIA:  1% local Xylocaine, 2 mg Nubain for sedation in the laboratory,  5 mg Valium p.o. premedication.   ATRIAL ELECTRODE:  [employer] number 1642T-52 cm, SN:  [id number].   VENTRICULAR ELECTRODE:  Pacesetter number 1646T-58 cm, SN:  [id number].    Unipolar thresholds:  P 2.5 MV, resistance 480, threshold 0.4 V.  Unipolar ventricular:  R greater than 30 MV, resistance 660 ohms, threshold  0.2V at 0.5 milliseconds.  Bipolar atrial:  P 3.2 MV, resistance 520 ohms, threshold 0.5 V at 0.5  milliseconds.  Bipolar ventricular:  R greater than 30 MV, resistance 958 ohms, threshold  0.4 V at 0.5 milliseconds.  There was no retrograde A-V conduction with ventricular pacing and atrial  recording through the implanted electrodes.  There was no diaphragmatic  stimulation at 10 volt output unipolar or bipolar with testing of implanted  electrodes.  Magna rate at BOL 98.5, EOL drops to 86.3.   PREOPERATIVE DIAGNOSIS:  1. Severe calcific aortic stenosis with heavy mitral and aortic annular     calcification.  2. Single vessel coronary disease at catheterization January 04, 2004.  3. Left bundle branch  block - postoperative complete heart block persistent.  4. Left ventricular dysfunction, left bundle branch block pattern.  5. AVR plus LIMA to LAD, CABG X 1 with 19 Edwards bovine pericardial tissue     valve, Dr. Cornelius Moras, January 05, 2004.  6. Systemic hypertension.  7. Gastroesophageal reflux disease.  8. Degenerative disc disease.  9. Remote right clavicle fracture with surgery greater than 20 years ago.  10.      Total right hip replacement; total right knee replacement.  11.      Remote smoker, one pack a day.   POSTOPERATIVE DIAGNOSIS:  1. Severe calcific aortic stenosis with heavy mitral and aortic annular     calcification.  2. Single vessel coronary disease at catheterization January 04, 2004.  3. Left bundle branch block - postoperative complete heart block persistent.  4. Left ventricular dysfunction, left bundle branch block pattern.  5. AVR plus LIMA to LAD,  CABG X 1 with 19 Edwards bovine pericardial tissue     valve, Dr. Cornelius Moras, January 05, 2004.  6. Systemic hypertension.  7. Gastroesophageal reflux disease.  8. Degenerative disc disease.  9. Remote right clavicle fracture with surgery greater than 20 years ago.  10.      Total right hip replacement; total right knee replacement.  11.      Remote smoker, one pack a day.   DESCRIPTION OF PROCEDURE:  The patient was brought to the second floor EP  lab in a post absorptive state after 5 mg Valium p.o. premedication.  The  right anterior chest was prepped and draped in the usual manner.  1%  Xylocaine was used for local anesthesia.  A right infraclavicular  curvilinear transverse incision was performed and brought down to the  prepectoral fascia using blunt dissection and electrocautery to control  hemostasis.  The subclavian vein was entered with a single anterior puncture  using an 18 thin wall needle and using two number 9 peel away Cook  introducers.  The atrial and ventricular electrodes were introduced in  tandem with a  retained stainless steel J-tip guide-wire in place.  The  ventricular electrode was positioned in the RV apex and the atrial electrode  in the right atrial appendage confirmed by rotational maneuvers and  fluoroscopy.  Because of mild scoliosis and cardiac rotation, best views  were in the RAO projection showing good position of the atrial and  ventricular electrodes.  Threshold testing was done, unipolar and bipolar,  along with retrograde conduction showing no retrograde V-A conduction.  There was good flow rate on both atrial and ventricular electrograms.  Unipolar and bipolar and excellent acute thresholds.  The pocket was  irrigated with 500 mg kanamycin solution.  The electrodes were secured at  the insertion site with a previously placed #1 silk figure-of-eight suture  around a tissue sewing collar to prevent migration and control hemostasis.  They were further secured with two interrupted #1 silk sutures around the  silicone sewing collar for each electrode.  The pocket was irrigated with  500 mg Kanamycin solution.  The sponge counts were correct.  The generator  was hooked to the electrode in the proper AV sequence with the single hex  nut tightened for each electrode.  The generator was delivered into the  pocket with the electrodes looped behind and loosely secured to the  underlying muscle and fascia with #1 silk suture to prevent migration.  The  subcutaneous tissue was closed with two separate running layers of 2-0  Vicryl suture and the skin was closed with 5-0 subcuticular  Vicryl suture.  Steri-Strips were applied.  Fluoroscopy showed good position  of the atrial and ventricular electrodes.  The patient was overriding her  pacer in sinus tachycardia at this time with normal sensing and she was  transferred to the holding area for postoperative programming in stable  condition.                                              Richard A. Alanda Amass, M.D.    RAW/MEDQ  D:   01/11/2004  T:  01/12/2004  Job:  [id number]   cc:   Salvatore Decent. Cornelius Moras, M.D.  [address]  [place]  [place] [address]   Nicki Guadalajara, M.D.  [phone] [address]  [place], [place] [address]  Fax: [phone]   Dani Gobble, MD  Fax: [phone]   Oneal Deputy. Juanetta Gosling, M.D.  [address]  [place]  [place] [address]  Fax: [phone]

## 2010-10-11 NOTE — Group Therapy Note (Signed)
[name], [name]                    ACCOUNT NO.:  [id number]   MEDICAL RECORD NO.:  [id number]          PATIENT TYPE:  INP   LOCATION:  A329                          FACILITY:  [facility]   PHYSICIAN:  Edward L. Juanetta Gosling, M.D.DATE OF BIRTH:  07-19-1931   DATE OF PROCEDURE:  DATE OF DISCHARGE:                                   PROGRESS NOTE   PROBLEMS:  1.  Esophagitis.  2.  Hypertension.  3.  Sinusitis.  4.  Status post valve replacement.   SUBJECTIVE:  [name] says she is doing much better and has no complaints.   OBJECTIVE:  GENERAL:  She is awake and alert.  VITAL SIGNS:  Temperature is 98.1, pulse 69, respirations 18, blood pressure  152/95.  O2 saturation is 96%.  CHEST:  Much clearer.  She looks much better in general.   ASSESSMENT:  She is improved.   PLAN:  Continue with her current treatments.  Discharge home.  Follow her  closely at home.  She is going to have home health services.  I have  discussed with her family the plan and they understand and will plan to  follow up in my office.      ELH/MEDQ  D:  08/20/2004  T:  08/20/2004  Job:  [id number]

## 2010-10-11 NOTE — Group Therapy Note (Signed)
NAMEDONDRA, RHETT                              ACCOUNT NO.:  [id number]   MEDICAL RECORD NO.:  [id number]                   PATIENT TYPE:  INP   LOCATION:  A313                                 FACILITY:  [facility]   PHYSICIAN:  Edward L. Juanetta Gosling, M.D.             DATE OF BIRTH:  1932/03/06   DATE OF PROCEDURE:  DATE OF DISCHARGE:                                   PROGRESS NOTE   PROBLEM:  Chest discomfort.   SUBJECTIVE:  Ms. Devora was admitted to the hospital with chest discomfort.  She has had cardiac enzymes done which show a slightly elevated troponin,  but the overall pattern is not consistent with a myocardial infarction.  She  says she is feeling better otherwise.   OBJECTIVE:  Her temperature is 97, pulse 77, respirations 18, blood pressure  114/70.  Chest is clear.  Heart is regular.  Abdomen is soft.   ASSESSMENT:  She is doing fairly well.   PLAN:  Get a cardiology consultation with [facility].  We will  continue with other treatments in the meantime.      ___________________________________________                                            Oneal Deputy Juanetta Gosling, M.D.   Gwenlyn Found  D:  01/01/2004  T:  01/01/2004  Job:  [id number]

## 2010-10-11 NOTE — H&P (Signed)
NAMEPARISH, AUGUSTINE                    ACCOUNT NO.:  [id number]   MEDICAL RECORD NO.:  [id number]          PATIENT TYPE:  INP   LOCATION:  A329                          FACILITY:  [facility]   PHYSICIAN:  Edward L. Juanetta Gosling, M.D.DATE OF BIRTH:  [birth year]   DATE OF ADMISSION:  08/16/2004  DATE OF DISCHARGE:  LH                                HISTORY & PHYSICAL   REASON FOR ADMISSION:  Hyponatremia.   HISTORY:  Ms. Skilton is a 77 year old who has had a complicated medical history  in the last 6 months or so. She had an aortic valve replacement and coronary  artery bypass grafting in August of 2005. She had been doing fairly well  postop, making a fairly slow progress when she developed what seemed to be  an upper respiratory infection. She was treated with Levaquin and a  decongestant but has continued to get weaker and weaker. She came to my  office today and was sent for a chest x-ray which really did not show very  much. She then had  blood work and was found to be hyponatremic. Because of  the hyponatremia she is being admitted to the hospital. She has been weak  and that has been her major symptom. She did have head congestion but that  seems to be a bit better. She has had generalized not feeling well, she has  been nauseated, she has severe reflux disease. She has been on Aciphex but  it does not seem to have made a lot of difference, and she has had diarrhea  recently.   PAST MEDICAL HISTORY:  1.  Positive for hypertension.  2.  Gastroesophageal reflux disease.  3.  Osteoporosis.  4.  Degenerative joint disease.  5.  Arthritis.   SURGERIES:  She has had a right total hip, right total knee, a fracture with  a displaced right clavicle, and the valve replacement and coronary artery  bypass in August of this last year.   MEDICATIONS:  1.  Aspirin 325 mg daily.  2.  Lopressor 50 mg b.i.d.  3.  Altace 10 mg daily.  4.  Aciphex 20 mg daily.  5.  Folic acid 2 mg daily.  6.  She has  recently been on Levaquin.   SOCIAL HISTORY:  She is a widow. She is a nonsmoker and  does not drink. She  lives at home but has family close by.   FAMILY HISTORY:  Positive for hypertension.   REVIEW OF SYSTEMS:  Except as mentioned is negative.   PHYSICAL EXAMINATION:  GENERAL:  She is a well-developed, well-nourished  female who does not appear to be in acute distress right now, but appears  weak and tired.  VITAL SIGNS:  Temperature is 97.2, pulse 74, respirations 20, blood pressure  161/93.  HEENT:  Shows that her pupils are equal, round, react to light and  accommodation. Nose and throat are clear.  NECK:  Supple without masses.  CHEST:  Clear without wheezes. She does have a midline sternotomy scar.  HEART:  Regular.  ABDOMEN:  Soft.  EXTREMITIES:  Showed no edema.   LABORATORY DATA:  White count was 13,700. Sodium was 109.   ASSESSMENT:  She has low sodium, her pressure is up a bit today but I would  like her to go ahead with holding her blood pressure medication at least  until tomorrow. Once her sodium is up I am going to obtain GI consultation,  we are going to go ahead and check her stools for O&P, etc., and give her  intravenous fluids, normal saline and have fluid restriction.      ELH/MEDQ  D:  08/16/2004  T:  08/16/2004  Job:  [id number]

## 2010-10-11 NOTE — Cardiovascular Report (Signed)
[name], [name]                              ACCOUNT NO.:  [id number]   MEDICAL RECORD NO.:  [id number]                   PATIENT TYPE:  INP   LOCATION:  2928                                 FACILITY:  [facility]   PHYSICIAN:  Nicki Guadalajara, M.D.                  DATE OF BIRTH:  11-05-1931   DATE OF PROCEDURE:  01/03/2004  DATE OF DISCHARGE:                              CARDIAC CATHETERIZATION   PROCEDURES:  Right and left heart catheterization, cine coronary  angiography, biplane cine left ventriculography, central aortography, distal  aortography, Swan-Ganz catheterization, cardiac output determinations.   INDICATIONS:  [name] is a 77 year old white female who has a history  of hypertension, gastroesophageal reflux disease, and recently had developed  chest pain and shortness of breath.  Physical examination examined a 3/6  systolic murmur.  A 2-D echocardiogram Doppler study confirmed severe aortic  valve stenosis with moderate concentric left ventricular hypertrophy, and a  peak instantaneous gradient of 121 mm, and a mean gradient of 84 mm  consistent with severe AS.  There was mild aortic insufficiency.  She also  had mild mitral annular calcification with mild mitral regurgitation, as  well as tricuspid regurgitation.  [facility] for right and left heart catheterization.   DESCRIPTION OF PROCEDURE:  After premedication with Valium intravenously,  the patient was prepped and draped in the usual fashion.  Her right femoral  artery and right femoral vein were punctured, and a 7-French arterial sheath  and 8 French venous sheath were inserted.  A Swan-Ganz catheter was inserted  into the right femoral vein and advanced under hemodynamic monitoring to the  RA, RV, PA and pulmonary capillary wedge positions.  A 6-French pigtail  catheter was advanced to the central aorta.  Simultaneous PA and AO  pressures were recorded.  Cardiac  outputs were obtained by the  thermodilution method, and Fick method.  O2 saturations were obtained in the  PA and AO.  Initial attempts at using a straight wire with a pigtail  catheter were unsuccessful in crossing a densely-calcified, highly-stenotic  aortic valve.  The pigtail catheter was then used for central aortography.  The pigtail catheter was then exchanged for a right coronary catheter.  Ultimately using a straight wire, the aortic valve was successfully crossed,  and a long exchange wire was inserted.  The pigtail catheter was then  exchanged and positioned in the left ventricle.  Simultaneous left  ventricular and femoral artery pressures were recorded.  Biplane cine left  ventriculography was performed.  An LV aortic pullback was then recorded,  and simultaneous AO, FA pressures were also recorded.  Thus, the aortography  was done with the pigtail catheter.  A 6-French left coronary catheter was  then inserted for selective angiography into the left coronary system.  A 6-  [place] Judkins  4 right catheter was used for selective angiography into the  right coronary artery.  Right heart pullback was performed prior to coronary  angiography.  The patient tolerated the procedure well and returned to her  room in satisfactory condition.  Hemostasis was obtained by direct manual  pressure.   HEMODYNAMIC DATA:  1. Right atrial pressure, A wave 9, mean 7.  2. Right ventricular pressure 27/7.  3. Pulmonary artery pressure 33/15, mean 22.  4. Mean pulmonary capillary wedge pressure 15.  5. Simultaneous AO, PA pressures were 135/78, and 34/13, respectively.  6. Initial LV/FA pressure was 212/15, and 142/70.  Second pressure was     207/15, and 142/69.  ALV pressure was 212/15 and 132/73, giving 80 mm     peak-to-peak gradient.  7. The left ventricular pressure was 209/16, and aortic pressure was 136/74.   O2 saturation in the pulmonary artery was 57%, and in the aorta was 88%.   Hemoglobin used for Fick determination was 13.9.   By the thermodilution method, cardiac output was 3.2, and cardiac index was  1.9.  By the Fick method, cardiac output was 2.7 liters per minute, cardiac  index 1.6 liters per minute per meter squared.  The aortic valve area  calculated to 0.3 square centimeters, consistent with severe aortic valve  stenosis.   FLUOROSCOPY:  Fluoroscopy revealed densely-calcified aortic valve with  marked reduction in excursion.  Central aortography again confirmed severe  reduction in excursion.  There was 1+ angiographic aortic regurgitation.  There was significant ascending aorta post stenotic dilatation.   BIPLANE CINE VENTRICULOGRAPHY:  Biplane cine ventriculography revealed  moderately-impaired global LV contractility with diffuse anterolateral and  inferior hypocontractility on the RAO projection, as well as septal and  inferolateral and LAO projection.  Ejection fraction estimate is 35%.  This  is currently being calculated.   DISTAL AORTOGRAPHY:  Revealed mildly tortuous infrarenal aorta.  There was  no evidence for renal artery stenosis.  There was no significant aortoiliac  disease.   CORONARY ANGIOGRAPHY:  Left main coronary artery was a short normal vessel  which trifurcated into an LAD, moderate-size ramus intermediate vessel, and  a codominant left circumflex system.   There was mild smooth 20% narrowing in the proximal LAD before the first  septal perforating artery.  The mid LAD had evidence for mild coronary  calcification with 20% narrowing prior to a mid diagonal vessel.  There was  40 to 50% narrowing just after this diagonal takeoff in the LAD which  appeared to be at least 50% on the lateral view.   The ramus intermediate vessel was free of significant disease.   The circumflex vessel was tortuous and gave rise to three marginal vessels  and ended in a small posterolateral system.  The right coronary artery was a small  vessel that had smooth narrowing of  20%.   IMPRESSION:  1. Severe, calcific aortic valve stenosis with a valve area of 0.3 square     centimeters.  2. There is 1+ aortic insufficiency.  3. Post-stenotic aortic root dilatation.  4. Moderately reduced left ventricular function with ejection fraction of     approximately 35% secondary to severe aortic stenosis.  5. Predominant single-vessel coronary artery disease with smooth 20%     proximal left anterior descending narrowing, 40 to 50% mid left anterior     descending stenosis, with mild coronary calcification in this mid LAD     segment, and evidence for 20% irregularity in  a small right coronary     artery.   RECOMMENDATION:  CVTS consultation will be obtained for aortic valve  replacement and possible single vessel bypass.                                               Nicki Guadalajara, M.D.    TK/MEDQ  D:  01/03/2004  T:  01/04/2004  Job:  [id number]   cc:   Dani Gobble, MD  Fax: [phone]   Delbert Harness, MD   [facility] Govern

## 2010-10-11 NOTE — Consult Note (Signed)
[name], HEINKE                              ACCOUNT NO.:  [id number]   MEDICAL RECORD NO.:  [id number]                   PATIENT TYPE:  INP   LOCATION:  2928                                 FACILITY:  [facility]   PHYSICIAN:  Salvatore Decent. Cornelius Moras, M.D.              DATE OF BIRTH:  1931/11/10   DATE OF CONSULTATION:  01/03/2004  DATE OF DISCHARGE:                                   CONSULTATION   REQUESTING PHYSICIAN:  Dr. Nicki Guadalajara.   PRIMARY CARDIOLOGIST:  Dr. Dani Gobble.   PRIMARY CARE PHYSICIAN:  Dr. Ramon Dredge L. Hawkins.   REASON FOR CONSULTATION:  Critical aortic stenosis.   HISTORY OF PRESENT ILLNESS:  [name] is a 77 year old widowed white female  from [place], [place].  She has been followed by Dr. Juanetta Gosling for many  years with past history notable for GE reflux disease and hypertension, as  well as osteoporosis, degenerative disk disease and arthritis.  The patient  states that she has remained active physically and functionally independent,  doing fairly well until recent months.  However, she has noted that for  several years, she has had intermittent mild problems with congestion.  She  has never been told that she has a heart murmur or abnormal heart valve.  Her husband passed away in 27-Jun-2022 of this year.  Since then, she has had  progressive decline with worsening symptoms of exertional shortness of  breath and congestion.  Her daughter reports that this has gotten quite  severe over the last several months.  Over the last week, the patient has  also developed intermittent symptoms of atypical chest pain.  The pain  initially is described as a burning epigastric pain, but more recently has  gotten more severe and has become more substernal in location with radiation  to her neck and to her back.  The pain is not necessarily associated with  physical activity, although it does seem to be at least partially relieved  by rest.  She has also had intermittent persistent  cough.  She initially  presented to see Dr. Ramon Dredge L. Hawkins more than a week ago with complaints  of burning epigastric pain, cough and congestion.  She was treated with  antibiotics and a cortisone injection and given a prescription for Aciphex  to treat presumed GE reflux disease.  Since then, her symptoms, however,  progressed and she ultimately presented to the emergency department at [facility] on December 30, 2003.  With worsening symptoms of substernal  chest pain, she was admitted for presumed evaluation of possible angina with  atypical chest pain.  She was noted to have some mild congestive heart  failure one exam.  Cardiology consultation was obtained on January 01, 2004  when Dr. Domingo Sep evaluated the patient.  She noted the presence of a  prominent murmur on physical exam suggestive of the presence  of aortic  stenosis.  B natriuretic peptide level was severely elevated at greater than  1200.  She had ruled out for acute myocardial infarction.  A 2-D  echocardiogram was performed at [facility].  Findings of this exam  were notable for the presence of critical aortic valve stenosis.  Specifically, the peak velocity across the aortic valve was 5.5 m/sec,  corresponding to a peak instantaneous gradient of 120 mmHg with a mean  transvalvular gradient of 84 mmHg.  The patient was noted to have mild  aortic insufficiency.  She was noted to have moderate concentric left  ventricular hypertrophy, mild mitral annular calcification and mild mitral  regurgitation, mild left atrial enlargement, and moderate left ventricular  dysfunction with ejection fraction estimated at 35% to 40%.  [name] was  subsequently transferred to [facility], where she underwent elective left  and right heart catheterization today by Dr. Tresa Endo.  Findings of this exam  are notable for the presence of critical aortic stenosis with a mean  transvalvular gradient of 80 mmHg and estimated aortic  valve area of 0.3 sq  cm.  She has only mild pulmonary hypertension and florid cardiac output is  preserved.  She was also noted to have 50% stenosis of the mid left anterior  descending coronary artery on coronary arteriogram.  There was otherwise  insignificant coronary artery disease and co-dominant coronary circulation.  There was some dilatation of the ascending thoracic aorta.  Cardiac surgical  consultation has been requested.   REVIEW OF SYSTEMS:  GENERAL:  Mrs. Frech reports that she has had good  appetite.  She has not been gaining or losing weight.  She has had a  difficult time over the last several months since her husband passed away in  Jun 10, 2022.  CARDIAC:  Notable for symptoms of substernal chest pain, both at  rest and with exertion as described previously.  Some of her symptoms are a  little atypical, although clearly some of them suggest the presence of  unstable angina.  The patient also describes exertional shortness of breath.  She can walk slowly without too much difficulty, but if she tries to walk at  a fast pace, she gets very short of breath and dizzy.  She can still go up  and down stairs if she goes slowly.  She denies any resting shortness of  breath, PND, orthopnea.  She has had some lower extremity edema.  She has  had several dizzy spells, a couple of which were quite severe.  She has not  had any syncopal episodes.  Her dizzy spells have been relieved by sitting  down and resting.  RESPIRATORY:  Notable for moderate exertional shortness  of breath.  The patient also has a cough which has remained somewhat  persistent over recent weeks.  For the most part, the cough is dry, although  occasionally productive of clear sputum.  She has also had occasional brief  episodes of yellow-tinged sputum or brown-tinged sputum.  She denies  hemoptysis or wheezing.  GASTROINTESTINAL:  Notable for burning epigastric pain which has been attributed to reflux.  However, the  patient denies  symptoms suggestive of food coming back up after swallowing or following  meals.  Some of her symptoms do seem to be exacerbated by meals.  She has no  difficulty swallowing.  She has no problems with constipation or diarrhea.  She denies history of hematochezia, hematemesis, melena.  MUSCULOSKELETAL:  Notable for problems with degenerative disk  disease involving her lower back  as well as arthritis and osteoporosis involving her hip and knees.  She  still gets around quite well without significant limitation.  NEUROLOGIC:  Negative.  The patient denies symptoms suggestive of previous TIA or stroke.  INFECTIOUS:  Negative.  The patient denies recent fevers or chills.  HEMATOLOGIC:  The patient reports being a free-bleeder and notes that she  tends to bruise easily and bleed more than typical if she cuts herself.  HEENT:  Negative.  The patient is edentulous with full-set upper and lower  dentures.  PSYCHIATRIC:  The patient has had some problems with anxiety and  depression, both related to her medical illness recently as well as her  recent loss of her husband last January.   PAST MEDICAL HISTORY:  1. Hypertension.  2. GE reflux disease.  3. Osteoporosis.  4. Degenerative disk disease.  5. Arthritis.   The patient specifically denies any previous history of congestive heart  failure, myocardial infarction, stroke, diabetes mellitus, or rheumatic  fever.   PAST SURGICAL HISTORY:  1. Right total hip replacement.  2. Right total knee replacement.  3. Repair of fractured and displaced right clavicle many years ago.   FAMILY HISTORY:  Noncontributory.   SOCIAL HISTORY:  The patient is widowed and lives alone.  She has 4 children  and numerous grandchildren.  She has a remote history of tobacco use,  smoking 1 pack of cigarettes per day for many years.  She quit smoking 20  years ago.  She denies history of excessive alcohol consumption.   MEDICATIONS:  Medications  at the time of admission on December 30, 2003 include  Aciphex, Phenergan, Keflex.  These were all started recently.   ALLERGIES:  The patient denies any known drug allergies or sensitivities.   PHYSICAL EXAM:  GENERAL:  Physical exam is notable for a thin white female  who appears her stated age, in no acute distress.  VITAL SIGNS:  Blood pressure is currently 90/50.  She is in sinus rhythm.  She is afebrile.  HEENT:  Exam is grossly unrevealing.  NECK:  The neck is supple.  There is no cervical and no supraclavicular  lymphadenopathy.  There is no jugular venous distention.  No carotid bruits  are noted.  LUNGS:  Auscultation of the chest includes a few inspiratory crackles at the  lung bases.  There are no wheezes or rhonchi.  Breath sounds are  symmetrical.  CARDIOVASCULAR:  Exam includes regular rate and rhythm.  There is a grade  3/6 systolic murmur heard best along the sternal border with radiation towards the neck and across the precordium.  No diastolic murmurs are noted.  ABDOMEN:  The abdomen is soft and nontender.  The liver edge is not  palpable.  Bowel sounds are present.  There are no palpable masses.  EXTREMITIES:  The extremities are warm and well-perfused.  There is mild  bilateral lower extremity edema.  There are previous scars from previous  right knee and right hip total replacement.  She has had cardiac  catheterization through the right common femoral approach today.  Distal  pulses are not palpable in either lower leg at the ankle.  There is no sign  of significant venous insufficiency.  RECTAL AND GU:  Exams are both deferred.  NEUROLOGIC:  Examination is grossly nonfocal.   LABORATORY DATA:  Blood work obtained this hospitalization include baseline  hemoglobin and hematocrit of 13.9 and 40%, respectively, on December 30, 2003.  Platelet count was 228,000, white blood count 6300.  Basic serum chemistries  are all within normal limits except for serum sodium of  134.  Baseline serum  creatinine is 0.7, total protein 6.4, albumin 3.9, liver function profile  within normal limits.  Fasting lipid profile was notable for a total  cholesterol of 154 with triglycerides 68, HDL 56, LDL 84.   Baseline portable chest x-ray is notable for elevated left hemidiaphragm.   DIAGNOSTIC TEST:  Cardiac catheterization performed by Dr. Tresa Endo today has  been reviewed.  Findings are notable for the presence of critical aortic  stenosis with mild left ventricular dysfunction.  There is 50% stenosis of  the left anterior descending coronary artery just after takeoff of a large  diagonal branch.  There is otherwise insignificant coronary artery disease.  The ascending aorta is mild-to-moderately dilated.   IMPRESSION:  Critical aortic stenosis with progressive symptoms of shortness  of breath with exertion, as well as recent onset of atypical chest pain  consistent with angina.  The patient has also had presyncopal episodes  without syncope.  I believe that she would best be treated by elective  aortic valve replacement.  Although the stenosis in the left anterior  descending coronary artery is not critical, I tend to favor coronary artery  bypass grafting of this vessel as well at the time of surgery.   PLAN:  I have discussed options at length with Mrs. Palla and her family.  Alternative treatment strategies have been discussed.  All of their  questions have been addressed.  We tentatively plan to proceed with aortic  valve replacement and coronary artery bypass grafting, first case, January 05, 2004.  We have also reviewed the relative risks and benefits of aortic  valve replacement using mechanical prosthesis with its associated need for  long-term anticoagulation with Coumadin versus use of a bioprosthetic tissue  valve and the possibility of late valve degeneration or failure, depending  upon patient longevity.  Under the circumstances, it seems probably  most appropriate to proceed with use of a bioprosthetic tissue valve, although  either option would be reasonable under the circumstances.  After  considerable discussion, Mrs. Tourangeau specifically requests that we use a tissue  valve, such that she will not necessarily require long-term anticoagulation  with Coumadin.  All of her questions have been addressed.  She understands  and accepts all associated risks of surgery including, but not limited to,  risks of death, stroke,  myocardial infarction, congestive heart failure, respiratory failure,  pneumonia, bleeding requiring blood transfusion, arrhythmia, heart block or  bradycardia requiring permanent pacemaker, recurrent coronary artery  disease, late valve degeneration or other problems related to the prosthetic  valve.                                               Salvatore Decent. Cornelius Moras, M.D.    CHO/MEDQ  D:  01/03/2004  T:  01/04/2004  Job:  [id number]   cc:   Nicki Guadalajara, M.D.  [phone] [address], [address]  [place], [place] [address]  Fax: [phone]   Dani Gobble, MD  Fax: [phone]   Oneal Deputy. Juanetta Gosling, M.D.  [address]  [employer] [place] [place]  [place] [address]  Fax: [phone]

## 2010-10-11 NOTE — Op Note (Signed)
NAMETRINIDAD, [name]                    ACCOUNT NO.:  [id number]   MEDICAL RECORD NO.:  [id number]          PATIENT TYPE:  INP   LOCATION:  A329                          FACILITY:  [facility]   PHYSICIAN:  Lionel December, M.D.    DATE OF BIRTH:  January 04, 1932   DATE OF PROCEDURE:  08/19/2004  DATE OF DISCHARGE:                                 OPERATIVE REPORT   PROCEDURE:  Esophagogastroduodenoscopy.   INDICATION:  [name] is a 77 year old 77 year old Caucasian female with chronic  GERD, whose symptoms are not well-controlled with therapy.  She has been  tried on various PPIs and Aciphex seemed to work the best, but she still has  heartburn every day along with regurgitation.  Dr. Juanetta Gosling requested GI  evaluation while she is now hospital for evaluation and treatment of her  hyponatremia.  The procedure risks were reviewed the patient, informed  consent was obtained.  She has a prosthetic aortic valve and was therefore  given SBE prophylaxis.   The procedure risks were reviewed the patient, informed consent was  obtained.   PREMEDICATION:  Cetacaine spray for pharyngeal topical anesthesia, Demerol  25 mg IV, Versed 2 mg IV.   FINDINGS:  Procedure performed in endoscopy suite.  The patient's vital  signs and O2 saturation were monitored during procedure and remained stable.  The patient was placed in the left lateral position and Olympus video scope  was passed via oropharynx without any difficulty into esophagus.   Esophagus:  Mucosa of the esophagus normal throughout.  GE junction was at  35 cm from the incisors.  There was a triangular patch of mucosa less than 1  cm about 50 mm proximal to GE junction.  This was biopsied for histology and during the process almost completely  ablated.  There was a 2-3 cm size sliding hiatal hernia.  Hiatus was 37 cm.   Stomach:  It was empty and distended very well with insufflation.  Folds of  the proximal stomach were normal.  Examination of the mucosa  revealed antral  granularity and a large antral scar.  The pyloric channel was patent.  Angularis, fundus and cardia examined by retroflexing the scope and were  normal.   Duodenum:  Bulbar mucosa was normal.  Scope was passed to the second part of  duodenum, where the mucosa and folds were normal.  Endoscope was withdrawn  and the patient tolerated the procedure well.   FINAL DIAGNOSES:  1.  Small sliding hiatal hernia.  2.  Abnormal patch of gastric-type mucosa 50 mm proximal to gastroesophageal      junction ,which was biopsied for histology.  This could be ectopic      gastric mucosa or a patch of Barrett's.  3.  Nonerosive antral gastritis with large antral scar indicative of prior      peptic ulcer disease with healing.  4.  Normal exam to the bulb, postbulbar duodenum.   RECOMMENDATIONS:  1.  Will continue Protonix as before.  2.  Add Reglan at 10 mg before breakfast and evening meal.  3.  H. pylori serology will be checked.      NR/MEDQ  D:  08/19/2004  Sandra:  08/19/2004  Job:  [id number]   cc:   Ramon Dredge L. Juanetta Gosling, M.D.  [address]  [place]  [place] [address]  Fax: [phone]

## 2010-10-11 NOTE — Group Therapy Note (Signed)
NAMEKENEISHA, HECKART                    ACCOUNT NO.:  [id number]   MEDICAL RECORD NO.:  [id number]          PATIENT TYPE:  INP   LOCATION:  A329                          FACILITY:  [facility]   PHYSICIAN:  Edward L. Juanetta Gosling, M.D.DATE OF BIRTH:  12-Sep-1931   DATE OF PROCEDURE:  08/18/2004  DATE OF DISCHARGE:                                   PROGRESS NOTE   PROBLEM:  1.  Hyponatremia.  2.  Status post valve replacement.  3.  Gastroesophageal reflux disease.  4.  Probable sinus infection.   SUBJECTIVE:  Ms. Roat says she is better.  She is sitting up, feels better,  she is able to eat a little more, and overall things are improved.   PHYSICAL EXAMINATION:  VITAL SIGNS:  Temperature 97, pulse is 86,  respirations 18, blood pressure 150/79.  CHEST:  Clear.   LABORATORY DATA:  Her sodium level now is up to 129.   ASSESSMENT:  She is much improved.   PLAN:  I am going to go ahead with plans for her to have gastrointestinal  evaluation.  I have discussed her situation with Dr. Karilyn Cota.      ELH/MEDQ  D:  08/18/2004  T:  08/18/2004  Job:  [id number]

## 2010-10-11 NOTE — Op Note (Signed)
[name], [name]                              ACCOUNT NO.:  [id number]   MEDICAL RECORD NO.:  [id number]                   PATIENT TYPE:  INP   LOCATION:  2311                                 FACILITY:  [facility]   PHYSICIAN:  Salvatore Decent. Cornelius Moras, M.D.              DATE OF BIRTH:  20-Jul-1931   DATE OF PROCEDURE:  01/05/2004  DATE OF DISCHARGE:                                 OPERATIVE REPORT   PREOPERATIVE DIAGNOSIS:  Critical aortic stenosis, single vessel coronary  artery disease.   POSTOPERATIVE DIAGNOSIS:  Critical aortic stenosis, single vessel coronary  artery disease.   OPERATION/PROCEDURE:  Median sternotomy for aortic valve replacement (19 mm  Edwards bovine pericardial tissue valve) and coronary artery bypass grafting  x1 (left internal mammary artery to distal left anterior descending coronary  artery).   SURGEON:  Salvatore Decent. Cornelius Moras, M.D.   ASSISTANT:  Kerin Perna, M.D.   ANESTHESIA:  General.   BRIEF CLINICAL NOTE:  The patient is a 77 year old widowed white female from  [place], [place].  She has been followed by Dr. Juanetta Gosling for many years  with known history of GE reflux disease and hypertension.  The patient now  presents with worsening symptoms of exertional shortness of breath and  congestion culminating in episode of Class IV congestive heart failure.  She  has also recently developed chest pain although some of her symptoms were  somewhat atypical.  She was admitted to the hospital at [facility]  on August 6.  She was noted to be in congestive heart failure.  Cardiology  consultation was obtained and the patient was evaluated by Dr. Domingo Sep on  August 8.  She was noted to have a murmur on physical examination consistent  with aortic stenosis.  She ruled out for acute myocardial infarction. A 2-D  echocardiogram confirmed the presence of critical aortic stenosis.  The  patient was subsequently referred to [facility] where she  underwent electively left and right heart catheterization by Dr. Daphene Jaeger  on August 10.  This confirmed the presence of critical aortic stenosis. The  patient was also found to  have single vessel coronary artery disease  involving the left anterior descending coronary artery.  There was mild left  ventricular dysfunction.  A full consultation note has been dictated  previously.   Following cardiac catheterization, the patient has remained somewhat  unstable with labile blood pressure.  After extensive consultation with the  patient and her daughter, the patient elected to proceed with aortic valve  replacement and coronary artery bypass grafting.  Alternative treatment  strategies have been discussed and reviewed.  All their questions have been  addressed.  They understand and accept all associated risks of surgery and  desired to proceed.   Preoperative chest x-ray demonstrates severe elevation of the left  hemidiaphragm.  A chest CT scan was performed  due to the presence of mild  dilatation of the ascending thoracic aorta.  This confirms mild dilatation  of aorta but also demonstrates what appears to be a large diaphragmatic  hernia on the left side.  Pulmonary function tests notable for moderately  decreased lung volume but the patient appears to have adequate vital  capacity to proceed with surgery as described.   OPERATIVE FINDINGS:  1. Critical aortic stenosis with congenital bicuspid aortic valve.  2. Moderate dilatation of the ascending thoracic aorta with maximum     transverse diameter 3.7 cm.  3. Large diaphragmatic hernia on the left side.  4. Moderate to severe left ventricular hypertrophy.   DESCRIPTION OF PROCEDURE:  The patient is brought to the operating room on  the above-mentioned date.  Essentially monitoring was established by  anesthesia service under the care and direction of Dr. Alphonzo Severance.  Specifically, a Swan-Ganz catheter is placed through the  right jugular  approach.  The radial arterial line is placed.  Intravenous antibiotics are  administered.  Of note, during one placement with the patient in the  preoperative holding area, the patient developed complete heart block with  heart rate in the 40s and 50s and mild associated hypotension.  The patient  was maintained on dopamine infusion.   The patient is brought to the operating room and general endotracheal  anesthesia is induced uneventfully.  A Foley catheter is placed.  The  patient's chest, abdomen, both groins, and both lower extremities are  prepped and draped in a sterile manner.   Baseline transesophageal echocardiogram is performed by Dr. Jean Rosenthal.  This  confirms the presence of aortic stenosis.  The aortic annulus itself is not  enlarged and measures approximately 21-22 mm in diameter.  The aorta becomes  moderately dilated with maximum transverse diameter 3.7 cm.  There is mild  aortic insufficiency.  There is mild mitral regurgitation.  There is  moderate to severe left ventricular hypertrophy.  No other abnormalities are  noted.   A median sternotomy incision is performed with the left internal mammary  artery dissected from the chest wall to prepare for bypass grafting.  The  left internal mammary artery is grafted.  The left internal mammary artery  is notably good quality conduit although somewhat good quality small  caliber.  It has adequate forward flow.  The patient's heparinized  systemically.   The pericardium is opened.  The ascending aorta is carefully inspected.  The  ascending aorta is moderately dilated in mid portion with maximum external  transverse diameter 3.7 cm.  It becomes more normal in diameter at the level  of the transverse arch.  It is somewhat tortuous due to somewhat lateral  displacement from the heart because of the severe elevation of the left hemidiaphragm to the associated abdominal contents present and the large   diaphragmatic hernia.  The hernia itself is inspected.  It clearly appears  to be chronic with fairly thick layer of parietal pleura.  It is difficult  to examine without creating hemodynamic instability in this setting and so  further examination was performed later after cardiopulmonary bypass has  been instituted.   The ascending aorta was cannulated uneventfully for cardiopulmonary bypass.  A two-stage venous cannula was placed in the right atrium.  Adequate  heparinization is verified.  Cardiopulmonary bypass is begun.  Retrograde  cardioplegia catheter is placed through the right atrium into the coronary  sinus.  A left ventricular vent was placed through the  right superior  pulmonary vein.   During full cardiopulmonary bypass and the lung deflated, the left chest is  now reexamined.  This does appear to be a true hernia of the diaphragm as  one can appreciate a rim of what appears to be diaphragmatic muscle  circumferentially with what seems to be a large central defect in the  diaphragm.  It is not possible to complete rule out a paraesophageal hernia,  although grossly this does not appear to be the case but rather a large  central tendon defect.  This defect is not apparent at this setting but any  attempt to repair delayed to be done at a later date once the patient has  recovered from her heart surgery completely.   The cardioplegia catheter was placed in the ascending aorta.  A temperature  probe was placed in the left ventricular septum.  The patient is cooled to  30 degrees systemic temperature.  The aortic crossclamp is applied and  cardioplegia is delivered initially in an antegrade fashion through the  aortic root.  Iced saline is flushed to allow for topical hypothermia.  The  initial cardioplegic arrest and myocardial occlusion now felt to be  excellent.  Supplemental cardioplegia is administered retrograde through the  coronary sinus catheter.  Repeat doses of  cardioplegia are administered  intermittently throughout the crossclamp portion of the operation through  the aortic root and retrograde through the coronary sinus cavity to maintain  septal temperature below 15 degrees C.  The following distal coronary bypass  anastomosis is performed:  Distal left anterior descending coronary artery is grafted with a left  internal mammary artery in an end-to-side fashion.  This coronary measured  1.5 mm in diameter at the site of distal bypass and is of good quality.  A  1.5 probe would not pass retrograde beyond the level of the plaque and the  left anterior descending coronary artery just after take-off of the diagonal  branch.  A 1.5 probe will easily pass distally down the vessel.   An oblique aortotomy is performed.  The aortic valve is inspected.  It is  congenitally bicuspid and critically stenotic with remarkably tiny residual hole.  Both leaflets of the aortic valve are severely calcified and hard and  essentially completely immobile.  It is truly remarkable that any cardiac  output could be generated under these circumstances.  The coronary artery  anatomy is notable in that the left main and the right coronary arteries are  180 degrees opposed from each other.  In addition, the left main coronary  artery is notably quite low with respect to the aortic annulus.  It is  somewhat eccentrically located towards the posterior commissure of the  aortic valve.  The right coronary artery is not quite as low near the  annulus but it is notably 180 degrees directly opposed from the left main  coronary ostium.  The aortic valve is excised sharply.  Aortic annulus is  decalcified.  There is extensive calcification although much of this is  within the leaflets and the annulus itself is less so affected.  After  adequate decalcification of the entire annulus, the aortic root and left  ventricular chamber are irrigated with copious iced saline solution.   The  aortic annulus is sized and appears to accept a 21 mm Edwards Magnum series  pericardial tissue valve.   The aortic valve replacement is performed using interrupted 2-0 Ethibond  horizontal mattress pledgeted sutures with pledgets  in the subannular  position.  A 21 mm Edwards Magnum series bovine pericardial tissue valve is  secured in place.  The valve appears to seat appropriately in the annulus.  It is perhaps slightly oversized for the diameter of the annulus and after  the valve is seated, it appears as though the sewing cuff of the valve is  obstructing the orifice of the left main coronary artery.  Because of this  concern, the 21 mm valve is removed.   Aortic valve replacement is now performed using interrupted 2-0 Ethibond  horizontal mattress sutures with pledgets again in the subannular position.  A 19 mm Edwards bovine pericardial tissue valve (Model #3000, serial  [id number]) secured in placed uneventfully.  After this valve was seated, the  valve appears to be completely seated normally.  There is much more room in  relation to the left main coronary artery which is clearly not affected by  the slightly smaller size valve.  Rewarming is begun.   The aortotomy is closed using a two-layer closure of running 4-0 Prolene  suture with Teflon felt strips to buttress the suture line.  The patient is  placed in the Trendelenburg position.  The left internal mammary artery is  reperfused and the septal temperature is noted to rise appropriately.  One  final dose of warm retrograde hot shot cardioplegia is administered.  The  lungs are ventilated and the heart allowed to fill in all areas evacuated  through the aortic root.  The aortic crossclamp was removed after a total  crossclamp time of 116 minutes.   The heart begins to beat spontaneously without need for cardioversion.  Retrograde cardioplegia catheter is removed.  A single distal coronary anastomosis is inspected for  hemostasis and appropriate graft orientation.  The aortotomy was also inspected for hemostasis.  The left ventricular vent  is removed.  Epicardial pacing wires are fixed to the right ventricular  outflow tract into the right atrial appendage.  The patient was rewarmed to  37 degrees C temperature.  The lungs are ventilated and heart allowed to  fill while the aortic root vent remains.  Once any residual air is cleared,  the aortic vent is discontinued.   The patient is weaned from cardiopulmonary bypass without difficulty.  The  patient's rhythm at separation from bypass is a slow junctional escape  rhythm.  Atrioventricular sequential pacing is employed.  Total  cardiopulmonary bypass time for the operation is 153 minutes.  The patient  was transfused two units of packed red blood cells during cardioplegia  bypass due to anemia which was present preoperatively and exacerbated by  cardiopulmonary bypass surgery.  The patient was weaned from bypass on  dopamine at 3 mcg per kg per minute.   Followup transesophageal echocardiogram performed by Dr. Jean Rosenthal after  separation from bypass demonstrates a well perfused aortic valve prosthesis.  There is no aortic insufficiency.  There is no mitral regurgitation.  Left  ventricular function appears preserved.  There is insignificant residual  air.  No other abnormalities are noted.   The venous and arterial cannulas are removed uneventfully.  The total  cardiopulmonary bypass time of the operation is 153 minutes.  Protamine is  administered to reverse the anticoagulation.  The mediastinum and left chest  are irrigated with saline solution containing vancomycin.  Meticulous  surgical hemostasis is ascertained.  Mediastinum in the left chest is  drained with three chest tubes placed through separate stab incisions  inferiorly.  The median sternotomy is closed in routine fashion.  The soft  tissues anteriorly closed in multiple layers and the skin  is closed with a  running subcuticular skin closure.  The patient tolerated the procedure  well.  There are no intraoperative complications.  All sponge, instrument  and needle counts were verified and correct at completion of the operation.  The patient was transfused two units fresh frozen plasma and one 10-pack of  adult platelets due to thrombocytopenia and mild coagulopathy after reversal  of heparin with protamine.                                               Salvatore Decent. Cornelius Moras, M.D.    CHO/MEDQ  D:  01/05/2004  T:  01/07/2004  Job:  [id number]   cc:   Dani Gobble, MD  Fax: [phone]   Nicki Guadalajara, M.D.  [phone] [address]  [place], [place] [address]  Fax: [phone]   Oneal Deputy. Juanetta Gosling, M.D.  [address]  [place]  [place] [address]  Fax: [phone]

## 2010-10-15 ENCOUNTER — Ambulatory Visit (HOSPITAL_COMMUNITY)
Admission: RE | Admit: 2010-10-15 | Discharge: 2010-10-15 | Disposition: A | Discharge status: Home / Self Care | Payer: Medicare Other | Source: Ambulatory Visit | Attending: Cardiovascular Disease | Admitting: Cardiovascular Disease

## 2010-10-15 ENCOUNTER — Encounter (HOSPITAL_COMMUNITY): Payer: Self-pay

## 2010-10-15 DIAGNOSIS — I77810 Thoracic aortic ectasia: Secondary | ICD-10-CM | POA: Insufficient documentation

## 2010-10-15 DIAGNOSIS — I719 Aortic aneurysm of unspecified site, without rupture: Secondary | ICD-10-CM

## 2010-10-15 HISTORY — DX: Essential (primary) hypertension: I10

## 2010-10-15 MED ORDER — IOHEXOL 350 MG/ML SOLN
100.0000 mL | Freq: Once | INTRAVENOUS | Status: AC | PRN
  Administered 2010-10-15: 100 mL via INTRAVENOUS

## 2011-12-31 HISTORY — PX: US ECHOCARDIOGRAPHY: HXRAD669

## 2012-09-13 ENCOUNTER — Encounter: Payer: Self-pay | Admitting: *Deleted

## 2012-09-28 LAB — PACEMAKER DEVICE OBSERVATION

## 2012-11-02 ENCOUNTER — Encounter: Payer: Self-pay | Admitting: Cardiovascular Disease

## 2013-02-13 ENCOUNTER — Encounter: Payer: Self-pay | Admitting: *Deleted

## 2013-02-18 ENCOUNTER — Encounter: Payer: Medicare Other | Admitting: Cardiovascular Disease

## 2013-03-09 ENCOUNTER — Encounter: Payer: Self-pay | Admitting: *Deleted

## 2013-03-14 ENCOUNTER — Encounter: Payer: Medicare Other | Admitting: Cardiovascular Disease

## 2013-03-23 ENCOUNTER — Encounter: Payer: Medicare Other | Admitting: Cardiovascular Disease

## 2013-03-28 ENCOUNTER — Ambulatory Visit (INDEPENDENT_AMBULATORY_CARE_PROVIDER_SITE_OTHER): Payer: Medicare Other | Admitting: Cardiovascular Disease

## 2013-03-28 ENCOUNTER — Encounter: Payer: Self-pay | Admitting: Cardiovascular Disease

## 2013-03-28 VITALS — BP 160/80 | HR 64 | Ht 63.0 in | Wt 121.8 lb

## 2013-03-28 DIAGNOSIS — Z952 Presence of prosthetic heart valve: Secondary | ICD-10-CM

## 2013-03-28 DIAGNOSIS — I10 Essential (primary) hypertension: Secondary | ICD-10-CM

## 2013-03-28 DIAGNOSIS — I2510 Atherosclerotic heart disease of native coronary artery without angina pectoris: Secondary | ICD-10-CM

## 2013-03-28 DIAGNOSIS — I739 Peripheral vascular disease, unspecified: Secondary | ICD-10-CM

## 2013-03-28 DIAGNOSIS — G5680 Other specified mononeuropathies of unspecified upper limb: Secondary | ICD-10-CM

## 2013-03-28 DIAGNOSIS — I7121 Aneurysm of the ascending aorta, without rupture: Secondary | ICD-10-CM

## 2013-03-28 DIAGNOSIS — I495 Sick sinus syndrome: Secondary | ICD-10-CM

## 2013-03-28 DIAGNOSIS — G5682 Other specified mononeuropathies of left upper limb: Secondary | ICD-10-CM

## 2013-03-28 DIAGNOSIS — Z953 Presence of xenogenic heart valve: Secondary | ICD-10-CM

## 2013-03-28 DIAGNOSIS — I712 Thoracic aortic aneurysm, without rupture: Secondary | ICD-10-CM

## 2013-03-28 DIAGNOSIS — E785 Hyperlipidemia, unspecified: Secondary | ICD-10-CM

## 2013-03-28 DIAGNOSIS — Z950 Presence of cardiac pacemaker: Secondary | ICD-10-CM

## 2013-03-28 DIAGNOSIS — I779 Disorder of arteries and arterioles, unspecified: Secondary | ICD-10-CM

## 2013-03-28 LAB — PACEMAKER DEVICE OBSERVATION
AL THRESHOLD: 0.75 V
ATRIAL PACING PM: 61
BAMS-0001: 160 {beats}/min
BAMS-0003: 60 {beats}/min
DEVICE MODEL PM: 1085575
RV LEAD THRESHOLD: 4.5 V

## 2013-03-28 NOTE — Patient Instructions (Addendum)
Dr. Royann Shivers recommends that you schedule a follow-up appointment in: 3 months with pacemaker ([employer]) check.  Continue current medications.

## 2013-03-31 ENCOUNTER — Telehealth: Payer: Self-pay | Admitting: Cardiovascular Disease

## 2013-03-31 ENCOUNTER — Ambulatory Visit (HOSPITAL_COMMUNITY)
Admission: RE | Admit: 2013-03-31 | Discharge: 2013-03-31 | Disposition: A | Discharge status: Home / Self Care | Payer: Medicare Other | Source: Ambulatory Visit | Attending: Cardiovascular Disease | Admitting: Cardiovascular Disease

## 2013-03-31 DIAGNOSIS — Z954 Presence of other heart-valve replacement: Secondary | ICD-10-CM | POA: Insufficient documentation

## 2013-03-31 DIAGNOSIS — J9819 Other pulmonary collapse: Secondary | ICD-10-CM | POA: Insufficient documentation

## 2013-03-31 DIAGNOSIS — I499 Cardiac arrhythmia, unspecified: Secondary | ICD-10-CM | POA: Insufficient documentation

## 2013-03-31 NOTE — Telephone Encounter (Signed)
Bill with [employer] said told advised him that her mothers pacemaker checks over the phone are to be discontinued and only done in the office now.  Cardio analysis needs to get a letter of discontinuation faxed to him . Fax [phone].

## 2013-03-31 NOTE — Telephone Encounter (Signed)
Message forwarded to S. Saunders, CMA r/t device/monitor concerns. 

## 2013-04-01 NOTE — Telephone Encounter (Signed)
Still needs an order to disconnect her rpacemaker check over the phone. Please fax to [phone] [employer].Bill  at [employer]

## 2013-04-05 NOTE — Telephone Encounter (Signed)
Order faxed.

## 2013-04-07 DIAGNOSIS — Z950 Presence of cardiac pacemaker: Secondary | ICD-10-CM | POA: Insufficient documentation

## 2013-04-07 DIAGNOSIS — I10 Essential (primary) hypertension: Secondary | ICD-10-CM | POA: Insufficient documentation

## 2013-04-07 DIAGNOSIS — I2510 Atherosclerotic heart disease of native coronary artery without angina pectoris: Secondary | ICD-10-CM | POA: Insufficient documentation

## 2013-04-07 DIAGNOSIS — G5680 Other specified mononeuropathies of unspecified upper limb: Secondary | ICD-10-CM | POA: Insufficient documentation

## 2013-04-07 DIAGNOSIS — E785 Hyperlipidemia, unspecified: Secondary | ICD-10-CM | POA: Insufficient documentation

## 2013-04-07 DIAGNOSIS — I739 Peripheral vascular disease, unspecified: Secondary | ICD-10-CM | POA: Insufficient documentation

## 2013-04-07 DIAGNOSIS — I712 Thoracic aortic aneurysm, without rupture: Secondary | ICD-10-CM | POA: Insufficient documentation

## 2013-04-07 DIAGNOSIS — Z953 Presence of xenogenic heart valve: Secondary | ICD-10-CM | POA: Insufficient documentation

## 2013-04-07 DIAGNOSIS — G588 Other specified mononeuropathies: Secondary | ICD-10-CM | POA: Insufficient documentation

## 2013-04-07 NOTE — Assessment & Plan Note (Signed)
Attempts at treatment with a variety of statins has always been associated with intolerable myalgias (simvastatin, Crestor, Vytorin).

## 2013-04-07 NOTE — Assessment & Plan Note (Signed)
Device was initially implanted immediately following aortic valve replacement for complete heart block which now paces the ventricle only 1% of the time. There is 60% atrial pacing. Pacemaker check in clinic. Sensing, as well as RA threshold and impedance, consistent with previous measurements. RV threshold and impedance have both increased significantly since last pacer check. Device programmed to maximize longevity. No mode switch episodes since 2013. No high ventricular rates noted. Device programmed at appropriate safety margins. Histogram distribution appropriate for patient activity level. Device programmed to optimize intrinsic conduction. Estimated longevity 2.5-6.5 years, which decreased to 1.75-6.75 years after output was permanently programmed to 6V@0 .6ms. Patient will f/u with a cxr for lead evaluation. At this point in time, ventricular lead revision does not appear to be imperative since she does not really have atrioventricular block. She will require an earlier generator change because of the high outputs and should receive a new ventricular lead at the time of generator change appeared if she should develop any symptoms suggestive of recurrent A-V block we will have to perform lead revision sooner.

## 2013-04-07 NOTE — Progress Notes (Signed)
Patient ID: [name], female   DOB: 1931/09/18, 79 y.o.   MRN: [id number]     Reason for office visit Status posterior valve replacement, CAD status post CABG, dual-chamber permanent pacemaker, hypertension, hyperlipidemia  [name] returns for a routine followup but has not had any significant changes in her health or functional ability. [name] becomes short of breath if she "rushes around". Has occasional mild ankle edema in the evenings that resolves after overnight recumbency. She has occasional dizziness with changes in position. She has a chronic cough and has chronic sinus congestion. She does not have any chest pain or syncope. She denies palpitations.  In 2005 she had aortic valve replacement with a 19 mm biological prosthesis and underwent single vessel LIMA to LAD bypass. Postoperatively she had complete heart block and received a dual-chamber permanent pacemaker, but at this point she has little if any evidence of AV block. She does have a chronic left bundle branch block. She required repeat surgical wiring and has a rather deformed sternum.  Interrogation of her pacemaker has shown a gradual worsening of right ventricular pacing thresholds, but on today's evaluation there has been a drastic worsening with a threshold of 4.5 V at 0.6 ms pulse. Thankfully she almost never paces the ventricle (less than 1%).  She is intolerant to statins due to myalgia. A couple of years ago she had a petechial rash that resolved after reduction in the dose of aspirin. There was no evidence of thrombocytopenia or regulation abnormalities. She has left hemidiaphragm elevation ever since her surgery.   Allergies  Allergen Reactions  . Altace [Ramipril] Cough  . Crestor [Rosuvastatin]     myalgias  . Zocor [Simvastatin] Other (See Comments)    Myalgias Blurred vision     Current Outpatient Prescriptions  Medication Sig Dispense Refill  . aspirin 81 MG tablet Take 81 mg by mouth daily.      .  budesonide-formoterol (SYMBICORT) 160-4.5 MCG/ACT inhaler Inhale 2 puffs into the lungs 2 (two) times daily.      . fluticasone (VERAMYST) 27.5 MCG/SPRAY nasal spray Place 2 sprays into the nose daily.      Marland Kitchen LORazepam (ATIVAN) 1 MG tablet Take 1 mg by mouth every 8 (eight) hours as needed for anxiety.      . metoprolol (LOPRESSOR) 50 MG tablet Take 50 mg by mouth daily.      Marland Kitchen telmisartan (MICARDIS) 40 MG tablet Take 40 mg by mouth daily.      . traMADol (ULTRAM) 50 MG tablet Take 50 mg by mouth every 8 (eight) hours as needed for pain.       No current facility-administered medications for this visit.    Past Medical History  Diagnosis Date  . Hypertension   . Hyperlipidemia   . CAD (coronary artery disease)     CABG 01/05/04-LIMA to LAD  . Aortic stenosis     AOV relacement bovine tissue valve 01/05/04  . CHB (complete heart block)     Permanent Pacemaker 01/11/04- St.Jude    Past Surgical History  Procedure Laterality Date  . Coronary artery bypass graft  01/05/04    LIMA to LAD  . Aortic valve replacement  01/05/04    Bovine tissue valve  . Permanent pacemaker insertion  01/11/04    St. Jude  . Sternal wound debridement  01/16/04    sternal rewiring  . US echocardiography  12/31/2011    moderate LAE,stage I diastolic dysfunction,trace MR,TR, bioprosthetic AOV  Family History  Problem Relation Age of Onset  . Heart failure Mother   . Cancer Brother     lung    History   Social History  . Marital Status: Widowed    Spouse Name: N/A    Number of Children: N/A  . Years of Education: N/A   Occupational History  . Not on file.   Social History Main Topics  . Smoking status: Former Smoker    Quit date: 05/25/1974  . Smokeless tobacco: Not on file  . Alcohol Use: No  . Drug Use: No  . Sexual Activity: Not on file   Other Topics Concern  . Not on file   Social History Narrative  . No narrative on file    Review of systems: The patient specifically denies any  chest pain at rest or with exertion, dyspnea at rest or with usual exertion, orthopnea, paroxysmal nocturnal dyspnea, syncope, palpitations, focal neurological deficits, intermittent claudication,unexplained weight gain, cough, hemoptysis or wheezing.  The patient also denies abdominal pain, nausea, vomiting, dysphagia, diarrhea, constipation, polyuria, polydipsia, dysuria, hematuria, frequency, urgency, abnormal bleeding or bruising, fever, chills, unexpected weight changes, mood swings, change in skin or hair texture, change in voice quality, auditory or visual problems, allergic reactions or rashes, new musculoskeletal complaints other than usual "aches and pains".   PHYSICAL EXAM BP 160/80  Pulse 64  Ht 5\' 3"  (1.6 m)  Wt 121 lb 12.8 oz (55.248 kg)  BMI 21.58 kg/m2  General: Alert, oriented x3, no distress Head: no evidence of trauma, PERRL, EOMI, no exophtalmos or lid lag, no myxedema, no xanthelasma; normal ears, nose and oropharynx Neck: normal jugular venous pulsations and no hepatojugular reflux; brisk carotid pulses without delay and no carotid bruits Chest: clear to auscultation, no May of consolidation by percussion or palpation, normal fremitus, symmetrical and full respiratory excursions; healthy pacemaker site, irregular sternotomy scar with lots of bony prominences Cardiovascular: normal position and quality of the apical impulse, regular rhythm, normal first and paradoxically split second heart sounds, grade 3/6 early peaking systolic ejection murmur, no diastolic murmurs, rubs or gallops Abdomen: no tenderness or distention, no masses by palpation, no abnormal pulsatility or arterial bruits, normal bowel sounds, no hepatosplenomegaly Extremities: no clubbing, cyanosis or edema; 2+ radial, ulnar and brachial pulses bilaterally; 2+ right femoral, posterior tibial and dorsalis pedis pulses; 2+ left femoral, posterior tibial and dorsalis pedis pulses; no subclavian or femoral  bruits Neurological: grossly nonfocal   EKG: Atrial paced ventricular sensed, left bundle branch block  July 2013 potassium 4.7 creatinine 0.66 hemoglobin 13.4 platelets 414,000 2011 total cholesterol 170, tragus was 92, HDL 55, LDL 97  ASSESSMENT AND PLAN CAD (coronary artery disease) Almost 10 years following bypass surgery she remains asymptomatic. Normal left ventricular systolic function by echo August 2013.  S/P aortic valve replacement with bioprosthetic valve Despite the relatively small size of her biological prosthesis (19 mm) she does not have symptoms of aortic stenosis and her most recent echo showed relatively low gradient (peak 25, mean 15 mm Hg). There was mild LVH, moderate left atrial enlargement and stage I diastolic dysfunction.  Pacemaker St. Jude identity XL dual-chamber implanted 2005 Device was initially implanted immediately following aortic valve replacement for complete heart block which now paces the ventricle only 1% of the time. There is 60% atrial pacing. Pacemaker check in clinic. Sensing, as well as RA threshold and impedance, consistent with previous measurements. RV threshold and impedance have both increased significantly since last pacer check.  Device programmed to maximize longevity. No mode switch episodes since 2013. No high ventricular rates noted. Device programmed at appropriate safety margins. Histogram distribution appropriate for patient activity level. Device programmed to optimize intrinsic conduction. Estimated longevity 2.5-6.5 years, which decreased to 1.75-6.75 years after output was permanently programmed to 6V@0 .6ms. Patient will f/u with a cxr for lead evaluation. At this point in time, ventricular lead revision does not appear to be imperative since she does not really have atrioventricular block. She will require an earlier generator change because of the high outputs and should receive a new ventricular lead at the time of generator change  appeared if she should develop any symptoms suggestive of recurrent A-V block we will have to perform lead revision sooner.   Hyperlipidemia Attempts at treatment with a variety of statins has always been associated with intolerable myalgias (simvastatin, Crestor, Vytorin).  HTN (hypertension) The elevated blood pressure today is atypical. No changes in any medications. We'll reevaluate at next appointment.  Subclavian artery disease Currently asymptomatic and without evidence of blood pressure differential between the left upper extremity. Atherosclerotic calcification incidentally noted on CT of the chest. This progresses it might place her at risk of coronary insufficiency via the LIMA to LAD bypass.  Orders Placed This Encounter  Procedures  . DG Chest 2 View  . Pacemaker Device Observation  . EKG 12-Lead   Patient Instructions  Dr. Royann Shivers recommends that you schedule a follow-up appointment in: 3 months with pacemaker (St.Jude) check.  Continue current medications.    Junious Silk, MD, [facility] [phone] office [phone] pager

## 2013-04-07 NOTE — Assessment & Plan Note (Signed)
Almost 10 years following bypass surgery she remains asymptomatic. Normal left ventricular systolic function by echo August 2013.

## 2013-04-07 NOTE — Assessment & Plan Note (Signed)
Currently asymptomatic and without evidence of blood pressure differential between the left upper extremity. Atherosclerotic calcification incidentally noted on CT of the chest. This progresses it might place her at risk of coronary insufficiency via the LIMA to LAD bypass.

## 2013-04-07 NOTE — Assessment & Plan Note (Signed)
The elevated blood pressure today is atypical. No changes in any medications. We'll reevaluate at next appointment.

## 2013-04-07 NOTE — Assessment & Plan Note (Signed)
Maximum diameter 43 mm stable in size for the last 10 years. 

## 2013-04-07 NOTE — Assessment & Plan Note (Addendum)
Despite the relatively small size of her biological prosthesis (19 mm) she does not have symptoms of aortic stenosis and her most recent echo showed relatively low gradient (peak 25, mean 15 mm Hg). There was mild LVH, moderate left atrial enlargement and stage I diastolic dysfunction.

## 2013-05-16 ENCOUNTER — Telehealth: Payer: Self-pay | Admitting: Cardiovascular Disease

## 2013-05-16 NOTE — Telephone Encounter (Signed)
Returned call.  Informed records indicate order was faxed on [phone] to Bill's attention.  RN informed they did not receive fax.  Informed message will be sent back to CMA to review.  Message forwarded to S. Lelon Perla, CMA r/t device/monitor concerns.

## 2013-05-16 NOTE — Telephone Encounter (Signed)
Daughter wanted them to discontinue pacemaker checks over the phone.Please fax a statement sying her pacemake checks will be discontinued. Fax#-[phone]

## 2013-05-24 NOTE — Telephone Encounter (Signed)
Form faxed for the 2nd time.

## 2013-06-28 ENCOUNTER — Encounter: Payer: Medicare Other | Admitting: Cardiovascular Disease

## 2013-07-21 ENCOUNTER — Encounter: Payer: Medicare Other | Admitting: Cardiovascular Disease

## 2013-07-26 ENCOUNTER — Telehealth (HOSPITAL_COMMUNITY): Payer: Self-pay | Admitting: *Deleted

## 2013-08-09 ENCOUNTER — Encounter: Payer: Medicare Other | Admitting: Cardiovascular Disease

## 2013-08-15 ENCOUNTER — Encounter: Payer: Medicare Other | Admitting: Cardiovascular Disease

## 2013-08-15 ENCOUNTER — Telehealth (HOSPITAL_COMMUNITY): Payer: Self-pay | Admitting: *Deleted

## 2013-08-17 ENCOUNTER — Other Ambulatory Visit (HOSPITAL_COMMUNITY): Payer: Self-pay | Admitting: Internal Medicine

## 2013-08-17 DIAGNOSIS — I359 Nonrheumatic aortic valve disorder, unspecified: Secondary | ICD-10-CM

## 2013-08-22 ENCOUNTER — Ambulatory Visit (INDEPENDENT_AMBULATORY_CARE_PROVIDER_SITE_OTHER): Payer: Medicare Other | Admitting: Cardiovascular Disease

## 2013-08-22 ENCOUNTER — Encounter: Payer: Self-pay | Admitting: Cardiovascular Disease

## 2013-08-22 VITALS — BP 140/70 | HR 60 | Resp 16 | Ht 63.0 in | Wt 117.0 lb

## 2013-08-22 DIAGNOSIS — I779 Disorder of arteries and arterioles, unspecified: Secondary | ICD-10-CM

## 2013-08-22 DIAGNOSIS — Z953 Presence of xenogenic heart valve: Secondary | ICD-10-CM

## 2013-08-22 DIAGNOSIS — Z950 Presence of cardiac pacemaker: Secondary | ICD-10-CM

## 2013-08-22 DIAGNOSIS — I495 Sick sinus syndrome: Secondary | ICD-10-CM

## 2013-08-22 DIAGNOSIS — I7121 Aneurysm of the ascending aorta, without rupture: Secondary | ICD-10-CM

## 2013-08-22 DIAGNOSIS — I712 Thoracic aortic aneurysm, without rupture, unspecified: Secondary | ICD-10-CM

## 2013-08-22 DIAGNOSIS — Z952 Presence of prosthetic heart valve: Secondary | ICD-10-CM

## 2013-08-22 DIAGNOSIS — I2510 Atherosclerotic heart disease of native coronary artery without angina pectoris: Secondary | ICD-10-CM

## 2013-08-22 DIAGNOSIS — I739 Peripheral vascular disease, unspecified: Secondary | ICD-10-CM

## 2013-08-22 LAB — PACEMAKER DEVICE OBSERVATION

## 2013-08-22 LAB — MDC_IDC_ENUM_SESS_TYPE_INCLINIC
Battery Impedance: 2200 Ohm
Battery Voltage: 2.76 V
Date Time Interrogation Session: 20150330184824
Implantable Pulse Generator Serial Number: 1085575
Lead Channel Impedance Value: 574 Ohm
Lead Channel Pacing Threshold Amplitude: 7.5 V
Lead Channel Pacing Threshold Pulse Width: 0.4 ms
Lead Channel Pacing Threshold Pulse Width: 0.6 ms
Lead Channel Setting Pacing Amplitude: 2 V
Lead Channel Setting Pacing Amplitude: 7.5 V
Lead Channel Setting Sensing Sensitivity: 3 mV
MDC IDC MSMT LEADCHNL RA PACING THRESHOLD AMPLITUDE: 0.75 V
MDC IDC MSMT LEADCHNL RA SENSING INTR AMPL: 0.9 mV
MDC IDC MSMT LEADCHNL RV IMPEDANCE VALUE: 1557 Ohm
MDC IDC MSMT LEADCHNL RV SENSING INTR AMPL: 7.3 mV
MDC IDC SET LEADCHNL RV PACING PULSEWIDTH: 0.6 ms
MDC IDC STAT BRADY RA PERCENT PACED: 52 %
MDC IDC STAT BRADY RV PERCENT PACED: 1 % — AB

## 2013-08-22 NOTE — Assessment & Plan Note (Addendum)
Is a rather small biological aortic valve prosthesis and is therefore at risk of recurrent aortic stenosis as the valve ages. At her most recent echocardiogram performed in August of 2013 the peak gradient was only 25 mm Hg and the mean gradient was 15 mm Hg. she seems to have worsening shortness of breath and we will repeat an echocardiogram.

## 2013-08-22 NOTE — Progress Notes (Signed)
Patient ID: [name], female   DOB: [birth year], 80 y.o.   MRN: [id number]      Reason for office visit Status posterior valve replacement, CAD status post CABG, dual-chamber permanent pacemaker, hypertension, hyperlipidemia   [name] returns for a routine followup but has not had any significant changes in her health or functional ability. She only becomes short of breath if she "rushes around". She thinks this may be a little worse than at her previous appointment. Has occasional mild ankle edema in the evenings that resolves after overnight recumbency. She has occasional dizziness with changes in position. She has a chronic cough and has chronic sinus congestion. She does not have any chest pain or syncope. She denies palpitations.   In 2005, she had aortic valve replacement with a 19 mm biological prosthesis and underwent single vessel LIMA to LAD bypass. Postoperatively she had complete heart block and received a dual-chamber permanent pacemaker, but at this point she has no evidence of AV block. She does have a chronic left bundle branch block. She required repeat surgical wiring and has a rather deformed sternum. Her right ventricular pacemaker lead has shown gradual worsening thresholds over the years, drastically worse over the last 12 months or so. She has mild dilation of the ascending aorta at 43 mm in diameter, unchanged over the last 10 years.  Interrogation of her pacemaker has shown further worsening of right ventricular pacing thresholds and today her lead impedance has also increased suggesting she has developed exit block. Thankfully she never paces the ventricle (less than 1%).  She has not experienced dizziness or syncope. Unfortunately her dual-chamber St. Jude pacemaker is not amenable to remote monitoring. At this point the estimated battery longevity is hard to obtain accurately due to the high ventricular pacing thresholds. The device reports estimated longevity of 2-6  years.  She is intolerant to statins due to myalgia. A couple of years ago she had a petechial rash that resolved after reduction in the dose of aspirin. There was no evidence of thrombocytopenia or coagulation abnormalities. She has chronic left hemidiaphragm elevation ever since her surgery.    Allergies  Allergen Reactions  . Altace [Ramipril] Cough  . Crestor [Rosuvastatin]     myalgias  . Zocor [Simvastatin] Other (See Comments)    Myalgias Blurred vision     Current Outpatient Prescriptions  Medication Sig Dispense Refill  . aspirin 81 MG tablet Take 81 mg by mouth daily.      . budesonide-formoterol (SYMBICORT) 160-4.5 MCG/ACT inhaler Inhale 2 puffs into the lungs 2 (two) times daily.      . fluticasone (VERAMYST) 27.5 MCG/SPRAY nasal spray Place 2 sprays into the nose daily.      Marland Kitchen. LORazepam (ATIVAN) 1 MG tablet Take 1 mg by mouth every 8 (eight) hours as needed for anxiety.      . metoprolol (LOPRESSOR) 50 MG tablet Take 50 mg by mouth daily.      Marland Kitchen. telmisartan (MICARDIS) 40 MG tablet Take 40 mg by mouth daily.      . traMADol (ULTRAM) 50 MG tablet Take 50 mg by mouth every 8 (eight) hours as needed for pain.       No current facility-administered medications for this visit.    Past Medical History  Diagnosis Date  . Hypertension   . Hyperlipidemia   . CAD (coronary artery disease)     CABG 01/05/04-LIMA to LAD  . Aortic stenosis     AOV relacement bovine  tissue valve 01/05/04  . CHB (complete heart block)     Permanent Pacemaker 01/11/04- St.Jude    Past Surgical History  Procedure Laterality Date  . Coronary artery bypass graft  01/05/04    LIMA to LAD  . Aortic valve replacement  01/05/04    Bovine tissue valve  . Permanent pacemaker insertion  01/11/04    St. Jude  . Sternal wound debridement  01/16/04    sternal rewiring  . US echocardiography  12/31/2011    moderate LAE,stage I diastolic dysfunction,trace MR,TR, bioprosthetic AOV    Family History   Problem Relation Age of Onset  . Heart failure Mother   . Cancer Brother     lung    History   Social History  . Marital Status: Widowed    Spouse Name: N/A    Number of Children: N/A  . Years of Education: N/A   Occupational History  . Not on file.   Social History Main Topics  . Smoking status: Former Smoker    Quit date: 05/25/1974  . Smokeless tobacco: Not on file  . Alcohol Use: No  . Drug Use: No  . Sexual Activity: Not on file   Other Topics Concern  . Not on file   Social History Narrative  . No narrative on file    Review of systems: The patient specifically denies any chest pain at rest or with exertion, dyspnea at rest or with usual exertion, orthopnea, paroxysmal nocturnal dyspnea, syncope, palpitations, focal neurological deficits, intermittent claudication,unexplained weight gain, cough, hemoptysis or wheezing.  The patient also denies abdominal pain, nausea, vomiting, dysphagia, diarrhea, constipation, polyuria, polydipsia, dysuria, hematuria, frequency, urgency, abnormal bleeding or bruising, fever, chills, unexpected weight changes, mood swings, change in skin or hair texture, change in voice quality, auditory or visual problems, allergic reactions or rashes, new musculoskeletal complaints other than usual "aches and pains".   PHYSICAL EXAM BP 140/70  Pulse 60  Resp 16  Ht 5\' 3"  (1.6 m)  Wt 53.071 kg (117 lb)  BMI 20.73 kg/m2 General: Alert, oriented x3, no distress  Head: no evidence of trauma, PERRL, EOMI, no exophtalmos or lid lag, no myxedema, no xanthelasma; normal ears, nose and oropharynx  Neck: normal jugular venous pulsations and no hepatojugular reflux; brisk carotid pulses without delay and no carotid bruits  Chest: clear to auscultation, no signs of consolidation by percussion or palpation, normal fremitus, symmetrical and full respiratory excursions; healthy pacemaker site, irregular sternotomy scar with lots of bony prominences   Cardiovascular: normal position and quality of the apical impulse, regular rhythm, normal first and paradoxically split second heart sounds, grade 3/6 early peaking systolic ejection murmur, no diastolic murmurs, rubs or gallops  Abdomen: no tenderness or distention, no masses by palpation, no abnormal pulsatility or arterial bruits, normal bowel sounds, no hepatosplenomegaly  Extremities: no clubbing, cyanosis or edema; 2+ radial, ulnar and brachial pulses bilaterally; 2+ right femoral, posterior tibial and dorsalis pedis pulses; 2+ left femoral, posterior tibial and dorsalis pedis pulses; no subclavian or femoral bruits  Neurological: grossly nonfocal   EKG: Atrial paced, ventricular sensed, left bundle branch block  July 2013 potassium 4.7 creatinine 0.66 hemoglobin 13.4 platelets 414,000  2011 total cholesterol 170, triglycerides 92, HDL 55, LDL 97    ASSESSMENT AND PLAN Ascending aortic aneurysm Maximum diameter 43 mm stable in size for the last 10 years.  CAD (coronary artery disease) Single-vessel internal mammary artery bypass to the LAD in 2005, currently free of angina or  other symptoms of coronary insufficiency. Need to obtain her more recent lipid profile  Pacemaker St. Jude identity XL dual-chamber implanted 2005 He has a malfunctioning right ventricular lead that is almost useless for pacing. Continues to sense well and she has not required ventricular pacing a very long time. There is 52% atrial pacing and less than 1% ventricular pacing. As long as she remains asymptomatic would continue with the current pacemaker. If she develops symptoms of dizziness, lightheadedness, syncope and evidence of ventricular loss of capture she should undergo revision of the ventricular lead. Otherwise we'll delay replacement of her ventricular lead until it is time for her generator change out. She is clearly not pacemaker dependent.  S/P aortic valve replacement with bioprosthetic valve Is a  rather small biological aortic valve prosthesis and is therefore at risk of recurrent aortic stenosis as the valve ages. At her most recent echocardiogram performed in August of 2013 the peak gradient was only 25 mm Hg and the mean gradient was 15 mm Hg. she seems to have worsening shortness of breath and we will repeat an echocardiogram.  Subclavian artery disease Was incidentally discovered on a CT of the chest and does not appear to be hemodynamically important. There is no difference in blood pressures between her right upper extremity and left upper extremity today. This needs to be monitored since worsening left subclavian stenosis may cause myocardial ischemia via the mammary artery bypass.   Patient Instructions  Your physician recommends that you schedule a follow-up appointment in: 3 months with [facility] for pacemaker check only.    Your physician recommends that you schedule a follow-up appointment in: 6  Months with Dr. Royann Shivers and pacemaker check.      Junious Silk, MD, [facility] [phone] office [phone] pager

## 2013-08-22 NOTE — Assessment & Plan Note (Addendum)
Was incidentally discovered on a CT of the chest and does not appear to be hemodynamically important. There is no difference in blood pressures between her right upper extremity and left upper extremity today. This needs to be monitored since worsening left subclavian stenosis may cause myocardial ischemia via the mammary artery bypass.

## 2013-08-22 NOTE — Assessment & Plan Note (Signed)
Single-vessel internal mammary artery bypass to the LAD in 2005, currently free of angina or other symptoms of coronary insufficiency. Need to obtain her more recent lipid profile

## 2013-08-22 NOTE — Patient Instructions (Addendum)
Your physician [facility] that you [facility]    Your physician recommends that you schedule a follow-up appointment in: 6  Months with Dr. Royann Shiversroitoru and pacemaker check.

## 2013-08-22 NOTE — Assessment & Plan Note (Signed)
He has a malfunctioning right ventricular lead that is almost useless for pacing. Continues to sense well and she has not required ventricular pacing a very long time. There is 52% atrial pacing and less than 1% ventricular pacing. As long as she remains asymptomatic would continue with the current pacemaker. If she develops symptoms of dizziness, lightheadedness, syncope and evidence of ventricular loss of capture she should undergo revision of the ventricular lead. Otherwise we'll delay replacement of her ventricular lead until it is time for her generator change out. She is clearly not pacemaker dependent.

## 2013-08-22 NOTE — Assessment & Plan Note (Signed)
Maximum diameter 43 mm stable in size for the last 10 years.

## 2013-08-25 ENCOUNTER — Encounter: Payer: Self-pay | Admitting: Cardiovascular Disease

## 2013-09-13 ENCOUNTER — Ambulatory Visit (HOSPITAL_COMMUNITY): Payer: Medicare Other

## 2013-09-21 ENCOUNTER — Ambulatory Visit (HOSPITAL_COMMUNITY)
Admission: RE | Admit: 2013-09-21 | Discharge: 2013-09-21 | Disposition: A | Discharge status: Home / Self Care | Payer: Medicare Other | Source: Ambulatory Visit | Attending: Internal Medicine | Admitting: Internal Medicine

## 2013-09-21 DIAGNOSIS — I359 Nonrheumatic aortic valve disorder, unspecified: Secondary | ICD-10-CM | POA: Insufficient documentation

## 2013-09-21 DIAGNOSIS — I059 Rheumatic mitral valve disease, unspecified: Secondary | ICD-10-CM

## 2013-09-21 NOTE — Progress Notes (Signed)
2D Echocardiogram Complete.  09/21/2013   Symon Norwood, RDCS 

## 2013-09-26 ENCOUNTER — Telehealth: Payer: Self-pay | Admitting: *Deleted

## 2013-09-26 DIAGNOSIS — I350 Nonrheumatic aortic (valve) stenosis: Secondary | ICD-10-CM

## 2013-09-26 NOTE — Telephone Encounter (Signed)
Message copied by Vita BarleyLASSITER, Gaberial Cada A on Mon Sep 26, 2013  9:04 AM ------      Message from: Thurmon FairROITORU, MIHAI      Created: Fri Sep 23, 2013  2:02 PM       Normal LV function. Prosthetic aortic valve gradients are slightly worse (mild-moderate AS), I would like to repeat this in 12 months, please ------

## 2013-09-26 NOTE — Telephone Encounter (Signed)
LM with Echo results.  Order placed for repeat in one year.

## 2013-12-14 ENCOUNTER — Encounter: Payer: Self-pay | Admitting: *Deleted

## 2013-12-16 ENCOUNTER — Telehealth: Payer: Self-pay | Admitting: Cardiovascular Disease

## 2013-12-16 NOTE — Telephone Encounter (Signed)
Closed encounter °

## 2013-12-17 ENCOUNTER — Encounter (HOSPITAL_COMMUNITY): Payer: Self-pay | Admitting: Emergency Medicine

## 2013-12-17 ENCOUNTER — Emergency Department (HOSPITAL_COMMUNITY): Payer: Medicare Other

## 2013-12-17 ENCOUNTER — Emergency Department (HOSPITAL_COMMUNITY)
Admission: EM | Admit: 2013-12-17 | Discharge: 2013-12-17 | Disposition: A | Discharge status: Home / Self Care | Payer: Medicare Other | Attending: Emergency Medicine | Admitting: Emergency Medicine

## 2013-12-17 DIAGNOSIS — J3489 Other specified disorders of nose and nasal sinuses: Secondary | ICD-10-CM | POA: Diagnosis not present

## 2013-12-17 DIAGNOSIS — I2510 Atherosclerotic heart disease of native coronary artery without angina pectoris: Secondary | ICD-10-CM | POA: Insufficient documentation

## 2013-12-17 DIAGNOSIS — M5410 Radiculopathy, site unspecified: Secondary | ICD-10-CM

## 2013-12-17 DIAGNOSIS — R319 Hematuria, unspecified: Secondary | ICD-10-CM | POA: Insufficient documentation

## 2013-12-17 DIAGNOSIS — Z7982 Long term (current) use of aspirin: Secondary | ICD-10-CM | POA: Insufficient documentation

## 2013-12-17 DIAGNOSIS — Z87891 Personal history of nicotine dependence: Secondary | ICD-10-CM | POA: Insufficient documentation

## 2013-12-17 DIAGNOSIS — R064 Hyperventilation: Secondary | ICD-10-CM | POA: Insufficient documentation

## 2013-12-17 DIAGNOSIS — F411 Generalized anxiety disorder: Secondary | ICD-10-CM | POA: Diagnosis not present

## 2013-12-17 DIAGNOSIS — R0981 Nasal congestion: Secondary | ICD-10-CM

## 2013-12-17 DIAGNOSIS — Z8639 Personal history of other endocrine, nutritional and metabolic disease: Secondary | ICD-10-CM | POA: Insufficient documentation

## 2013-12-17 DIAGNOSIS — Z79899 Other long term (current) drug therapy: Secondary | ICD-10-CM | POA: Insufficient documentation

## 2013-12-17 DIAGNOSIS — Z862 Personal history of diseases of the blood and blood-forming organs and certain disorders involving the immune mechanism: Secondary | ICD-10-CM | POA: Insufficient documentation

## 2013-12-17 DIAGNOSIS — F419 Anxiety disorder, unspecified: Secondary | ICD-10-CM

## 2013-12-17 DIAGNOSIS — IMO0002 Reserved for concepts with insufficient information to code with codable children: Secondary | ICD-10-CM | POA: Insufficient documentation

## 2013-12-17 DIAGNOSIS — Z951 Presence of aortocoronary bypass graft: Secondary | ICD-10-CM | POA: Diagnosis not present

## 2013-12-17 DIAGNOSIS — R0602 Shortness of breath: Secondary | ICD-10-CM | POA: Insufficient documentation

## 2013-12-17 DIAGNOSIS — I10 Essential (primary) hypertension: Secondary | ICD-10-CM | POA: Insufficient documentation

## 2013-12-17 HISTORY — DX: Bronchitis, not specified as acute or chronic: J40

## 2013-12-17 LAB — COMPREHENSIVE METABOLIC PANEL
ALT: 13 U/L (ref 0–35)
AST: 20 U/L (ref 0–37)
Albumin: 4.5 g/dL (ref 3.5–5.2)
Alkaline Phosphatase: 68 U/L (ref 39–117)
Anion gap: 16 — ABNORMAL HIGH (ref 5–15)
BUN: 8 mg/dL (ref 6–23)
CALCIUM: 9.6 mg/dL (ref 8.4–10.5)
CO2: 21 meq/L (ref 19–32)
CREATININE: 0.57 mg/dL (ref 0.50–1.10)
Chloride: 92 mEq/L — ABNORMAL LOW (ref 96–112)
GFR, EST NON AFRICAN AMERICAN: 86 mL/min — AB (ref >90)
GLUCOSE: 115 mg/dL — AB (ref 70–99)
Potassium: 4.2 mEq/L (ref 3.7–5.3)
Sodium: 129 mEq/L — ABNORMAL LOW (ref 137–147)
Total Bilirubin: 0.6 mg/dL (ref 0.3–1.2)
Total Protein: 7.1 g/dL (ref 6.0–8.3)

## 2013-12-17 LAB — CBC WITH DIFFERENTIAL/PLATELET
Basophils Absolute: 0 10*3/uL (ref 0.0–0.1)
Basophils Relative: 1 % (ref 0–1)
EOS PCT: 0 % (ref 0–5)
Eosinophils Absolute: 0 10*3/uL (ref 0.0–0.7)
HEMATOCRIT: 41.4 % (ref 36.0–46.0)
Hemoglobin: 14.9 g/dL (ref 12.0–15.0)
LYMPHS ABS: 1.5 10*3/uL (ref 0.7–4.0)
Lymphocytes Relative: 23 % (ref 12–46)
MCH: 31.4 pg (ref 26.0–34.0)
MCHC: 36 g/dL (ref 30.0–36.0)
MCV: 87.3 fL (ref 78.0–100.0)
MONO ABS: 0.5 10*3/uL (ref 0.1–1.0)
Monocytes Relative: 7 % (ref 3–12)
Neutro Abs: 4.6 10*3/uL (ref 1.7–7.7)
Neutrophils Relative %: 69 % (ref 43–77)
Platelets: 274 10*3/uL (ref 150–400)
RBC: 4.74 MIL/uL (ref 3.87–5.11)
RDW: 13.2 % (ref 11.5–15.5)
WBC: 6.6 10*3/uL (ref 4.0–10.5)

## 2013-12-17 LAB — URINALYSIS, ROUTINE W REFLEX MICROSCOPIC
Bilirubin Urine: NEGATIVE
Glucose, UA: NEGATIVE mg/dL
Leukocytes, UA: NEGATIVE
NITRITE: NEGATIVE
PROTEIN: NEGATIVE mg/dL
Specific Gravity, Urine: 1.01 (ref 1.005–1.030)
Urobilinogen, UA: 0.2 mg/dL (ref 0.0–1.0)
pH: 8 (ref 5.0–8.0)

## 2013-12-17 LAB — PRO B NATRIURETIC PEPTIDE: PRO B NATRI PEPTIDE: 1082 pg/mL — AB (ref 0–450)

## 2013-12-17 LAB — LIPASE, BLOOD: Lipase: 47 U/L (ref 11–59)

## 2013-12-17 LAB — URINE MICROSCOPIC-ADD ON

## 2013-12-17 LAB — TROPONIN I: Troponin I: <0.3 ng/mL (ref <0.30)

## 2013-12-17 MED ORDER — SODIUM CHLORIDE 0.9 % IV SOLN
INTRAVENOUS | Status: DC
  Administered 2013-12-17: 12:00:00 via INTRAVENOUS

## 2013-12-17 NOTE — ED Provider Notes (Signed)
CSN: [id number]     Arrival date & time 12/17/13  1046 History  This chart was scribed for Ward Givens, MD by [place] Peace, ED Scribe. The patient was seen in APA01/APA01. The patient's care was started at 10:52 AM.      Chief Complaint  Patient presents with  . Shortness of Breath   Level 5 Caveat: for age   Patient is a 80 y.o. female presenting with shortness of breath. The history is provided by the patient. No language interpreter was used.  Shortness of Breath Associated symptoms: abdominal pain and cough   Associated symptoms: no chest pain   HPI Comments: [name] is a 80 y.o. female who presents to the Emergency Department complaining of ?severe SOB onset earlier today with associated productive cough and leg swelling. She also reports experiencing numbness in her fingers. Pt reports history of aortic valve placement and pacemaker in 2005. Pt's daughter reports that she lives by herself but her son lives close by and checks on her frequently. Pt's daughter reports that pt called them on the phone and told them she needed someone to come but did not say why. After arriving, Pt's daughter further reports pt looked very pale and was breathing extremely hard. She denies any CP. She reports history of smoking but states she no longer does. Patient has chronic pain in her neck that radiates into her body. Denies any chest pain. She denies fever. She does not use oxygen at home. She states has a history of chronic bronchitis. She was given a Z-Pak 2 weeks ago when she was coughing up brown sputum. She states her cough that is dry. She states this has been short of breath for a week. Patient is on Ativan 1 mg 4 times a day. Patient states "I don't want to be put on that machine!"    Her PCP is Dr. Juanetta Gosling.   Past Medical History  Diagnosis Date  . Hypertension   . Hyperlipidemia   . CAD (coronary artery disease)     CABG 01/05/04-LIMA to LAD  . Aortic stenosis     AOV relacement bovine  tissue valve 01/05/04  . CHB (complete heart block)     Permanent Pacemaker 01/11/04- St.Jude  . Bronchitis    Past Surgical History  Procedure Laterality Date  . Coronary artery bypass graft  01/05/04    LIMA to LAD  . Aortic valve replacement  01/05/04    Bovine tissue valve  . Permanent pacemaker insertion  01/11/04    St. Jude  . Sternal wound debridement  01/16/04    sternal rewiring  . US echocardiography  12/31/2011    moderate LAE,stage I diastolic dysfunction,trace MR,TR, bioprosthetic AOV   Family History  Problem Relation Age of Onset  . Heart failure Mother   . Cancer Brother     lung   History  Substance Use Topics  . Smoking status: Former Smoker    Quit date: 05/25/1974  . Smokeless tobacco: Not on file  . Alcohol Use: No   Lives at home  Lives alone  OB History   Grav Para Term Preterm Abortions TAB SAB Ect Mult Living                 Review of Systems  Unable to perform ROS: Age  Respiratory: Positive for cough and shortness of breath.   Cardiovascular: Positive for leg swelling. Negative for chest pain.  Gastrointestinal: Positive for abdominal pain.  Genitourinary:  Difficulty passing BM.   All other systems reviewed and are negative.     Allergies  Altace; Crestor; and Zocor  Home Medications   Prior to Admission medications   Medication Sig Start Date End Date Taking? Authorizing Provider  acetaminophen (TYLENOL) 500 MG tablet Take 500 mg by mouth daily as needed for moderate pain.   Yes Historical Provider, MD  ALOE VERA PO Take 2 capsules by mouth daily as needed (bowel movement).   Yes Historical Provider, MD  aspirin 81 MG tablet Take 81 mg by mouth daily.   Yes Historical Provider, MD  budesonide-formoterol (SYMBICORT) 160-4.5 MCG/ACT inhaler Inhale 2 puffs into the lungs 2 (two) times daily.   Yes Historical Provider, MD  LORazepam (ATIVAN) 1 MG tablet Take 1 mg by mouth every 8 (eight) hours as needed for anxiety.   Yes Historical  Provider, MD  metoprolol (LOPRESSOR) 50 MG tablet Take 50 mg by mouth daily.   Yes Historical Provider, MD  Multiple Vitamin (MULTIVITAMIN WITH MINERALS) TABS tablet Take 1 tablet by mouth daily.   Yes Historical Provider, MD  telmisartan (MICARDIS) 40 MG tablet Take 20 mg by mouth daily.    Yes Historical Provider, MD  traMADol (ULTRAM) 50 MG tablet Take 50-100 mg by mouth every 8 (eight) hours as needed for moderate pain.    Yes Historical Provider, MD  triamcinolone (NASACORT AQ) 55 MCG/ACT AERO nasal inhaler Place 2 sprays into the nose daily.   Yes Historical Provider, MD   BP 158/92  Pulse 75  Temp(Src) 98.5 F (36.9 C) (Oral)  Resp 26  SpO2 100%   Vital signs normal except tachypneia  Physical Exam  Nursing note and vitals reviewed. Constitutional: She is oriented to person, place, and time.  Non-toxic appearance. She does not appear ill. No distress.  Frail elderly female who is anxious and hyperventilating complaining of hands being numb.    HENT:  Head: Normocephalic and atraumatic.  Right Ear: External ear normal.  Left Ear: External ear normal.  Nose: Nose normal. No mucosal edema or rhinorrhea.  Mouth/Throat: Oropharynx is clear and moist and mucous membranes are normal. No dental abscesses or uvula swelling.  Eyes: Conjunctivae and EOM are normal. Pupils are equal, round, and reactive to light.  Neck: Normal range of motion and full passive range of motion without pain. Neck supple.  Cardiovascular: Normal rate, regular rhythm and normal heart sounds.  Exam reveals no gallop and no friction rub.   No murmur heard. Pulmonary/Chest: Accessory muscle usage present. Tachypnea noted. She is in respiratory distress. She has no decreased breath sounds. She has no wheezes. She has no rhonchi. She has no rales. She exhibits no tenderness and no crepitus.  Abdominal: Soft. Normal appearance and bowel sounds are normal. She exhibits no distension. There is no tenderness. There is no  rebound and no guarding.  Musculoskeletal: Normal range of motion. She exhibits edema. She exhibits no tenderness.  Mild edema of lower legs bilaterally.   Neurological: She is alert and oriented to person, place, and time. She has normal strength. No cranial nerve deficit.  Skin: Skin is warm, dry and intact. No rash noted. No erythema. No pallor.  Psychiatric: She has a normal mood and affect. Her speech is normal and behavior is normal. Her mood appears not anxious.    ED Course  Procedures (including critical care time)  Medications  0.9 %  sodium chloride infusion ( Intravenous New Bag/Given 12/17/13 1140)     DIAGNOSTIC  STUDIES: Oxygen Saturation is 100% on room air, normal by my interpretation.    COORDINATION OF CARE: 10:56 PM- Treatment plan was discussed with patient who verbalizes understanding and agrees.   Recheck at discharge. Pt is calmly sitting on her stretcher. When asked what her main problem was when she called her family that she needed to be seen, she states her pain in her neck that was radiating into her body was bothering her, better now. Her worst problem at this minute is nasal congestion. She was recently started on nasacort OTC for this. She denies any abdominal pain, nausea or vomiting.   Labs Review   Results for orders placed during the hospital encounter of 12/17/13  CBC WITH DIFFERENTIAL      Result Value Ref Range   WBC 6.6  4.0 - 10.5 K/uL   RBC 4.74  3.87 - 5.11 MIL/uL   Hemoglobin 14.9  12.0 - 15.0 g/dL   HCT 16.1  09.6 - 04.5 %   MCV 87.3  78.0 - 100.0 fL   MCH 31.4  26.0 - 34.0 pg   MCHC 36.0  30.0 - 36.0 g/dL   RDW 40.9  81.1 - 91.4 %   Platelets 274  150 - 400 K/uL   Neutrophils Relative % 69  43 - 77 %   Neutro Abs 4.6  1.7 - 7.7 K/uL   Lymphocytes Relative 23  12 - 46 %   Lymphs Abs 1.5  0.7 - 4.0 K/uL   Monocytes Relative 7  3 - 12 %   Monocytes Absolute 0.5  0.1 - 1.0 K/uL   Eosinophils Relative 0  0 - 5 %   Eosinophils  Absolute 0.0  0.0 - 0.7 K/uL   Basophils Relative 1  0 - 1 %   Basophils Absolute 0.0  0.0 - 0.1 K/uL  COMPREHENSIVE METABOLIC PANEL      Result Value Ref Range   Sodium 129 (*) 137 - 147 mEq/L   Potassium 4.2  3.7 - 5.3 mEq/L   Chloride 92 (*) 96 - 112 mEq/L   CO2 21  19 - 32 mEq/L   Glucose, Bld 115 (*) 70 - 99 mg/dL   BUN 8  6 - 23 mg/dL   Creatinine, Ser 7.82  0.50 - 1.10 mg/dL   Calcium 9.6  8.4 - 95.6 mg/dL   Total Protein 7.1  6.0 - 8.3 g/dL   Albumin 4.5  3.5 - 5.2 g/dL   AST 20  0 - 37 U/L   ALT 13  0 - 35 U/L   Alkaline Phosphatase 68  39 - 117 U/L   Total Bilirubin 0.6  0.3 - 1.2 mg/dL   GFR calc non Af Amer 86 (*) >90 mL/min   GFR calc Af Amer >90  >90 mL/min   Anion gap 16 (*) 5 - 15  PRO B NATRIURETIC PEPTIDE      Result Value Ref Range   Pro B Natriuretic peptide (BNP) 1082.0 (*) 0 - 450 pg/mL  URINALYSIS, ROUTINE W REFLEX MICROSCOPIC      Result Value Ref Range   Color, Urine YELLOW  YELLOW   APPearance CLEAR  CLEAR   Specific Gravity, Urine 1.010  1.005 - 1.030   pH 8.0  5.0 - 8.0   Glucose, UA NEGATIVE  NEGATIVE mg/dL   Hgb urine dipstick MODERATE (*) NEGATIVE   Bilirubin Urine NEGATIVE  NEGATIVE   Ketones, ur TRACE (*) NEGATIVE mg/dL   Protein, ur NEGATIVE  NEGATIVE mg/dL   Urobilinogen, UA 0.2  0.0 - 1.0 mg/dL   Nitrite NEGATIVE  NEGATIVE   Leukocytes, UA NEGATIVE  NEGATIVE  LIPASE, BLOOD      Result Value Ref Range   Lipase 47  11 - 59 U/L  TROPONIN I      Result Value Ref Range   Troponin I <0.30  <0.30 ng/mL  URINE MICROSCOPIC-ADD ON      Result Value Ref Range   RBC / HPF 7-10  <3 RBC/hpf   Laboratory interpretation all normal except microscopic hematuria    Imaging Review Dg Chest Portable 1 View  12/17/2013   CLINICAL DATA:  Shortness of breath.  EXAM: PORTABLE CHEST - 1 VIEW  COMPARISON:  03/31/2013.  FINDINGS: The heart is normal in size. Stable mediastinal and hilar contours. Stable elevation of the left hemidiaphragm with overlying  vascular crowding and atelectasis. Chronic underlying lung changes but no definite acute findings.  IMPRESSION: Stable elevation of the left hemidiaphragm with overlying vascular crowding and atelectasis.  No acute pulmonary findings.   Electronically Signed   By: Loralie Champagne M.D.   On: 12/17/2013 11:59   Dg Abd Portable 2v  12/17/2013   CLINICAL DATA:  Abdominal pain.  EXAM: PORTABLE ABDOMEN - 2 VIEW  COMPARISON:  None.  FINDINGS: There is moderate air throughout the small bowel and colon which may suggest a mild ileus or gastroenteritis. No findings for small bowel obstruction or free air. Stable aortic calcifications. The bony structures are intact.  IMPRESSION: Probable mild diffuse ileus.   Electronically Signed   By: Loralie Champagne M.D.   On: 12/17/2013 12:00     EKG Interpretation   Date/Time:  Saturday December 17 2013 10:58:04 EDT Ventricular Rate:  75 PR Interval:  148 QRS Duration: 135 QT Interval:  422 QTC Calculation: 471 R Axis:   11 Text Interpretation:  Sinus rhythm Left bundle branch block No significant  change since last tracing 11 Jan 2004 Confirmed by [facility]  MD-I, Rilee Wendling  ([id number]) on 12/17/2013 11:57:18 AM      Level 5 Caveat  MDM   Final diagnoses:  Nasal congestion  Radicular syndrome of right leg  Anxiety  Hyperventilation  Hematuria     Plan discharge  Devoria Albe, MD, FACEP   I personally performed the services described in this documentation, which was scribed in my presence. The recorded information has been reviewed and considered.  Devoria Albe, MD, FACEP    Ward Givens, MD 12/17/13 [phone]

## 2013-12-17 NOTE — ED Notes (Signed)
Pt also reports "some" leg swelling.

## 2013-12-17 NOTE — Discharge Instructions (Signed)
You can take claritin OTC for your nasal congestion or just continue the nasacort OTC spray you recently started. Have Dr Juanetta GoslingHawkins follow up on the blood in your urine. Recheck as needed.

## 2013-12-17 NOTE — ED Notes (Signed)
Pt c/o SOB and cough x1 week. Pt states cough is productive. Pt was recently rx abx by PCP.

## 2013-12-25 ENCOUNTER — Emergency Department (HOSPITAL_COMMUNITY): Payer: Medicare Other

## 2013-12-25 ENCOUNTER — Encounter (HOSPITAL_COMMUNITY): Payer: Self-pay | Admitting: Emergency Medicine

## 2013-12-25 ENCOUNTER — Inpatient Hospital Stay (HOSPITAL_COMMUNITY)
Admission: EM | Admit: 2013-12-25 | Discharge: 2014-01-24 | DRG: 640 | Disposition: E | Payer: Medicare Other | Attending: Pulmonary Disease | Admitting: Pulmonary Disease

## 2013-12-25 DIAGNOSIS — I2510 Atherosclerotic heart disease of native coronary artery without angina pectoris: Secondary | ICD-10-CM | POA: Diagnosis present

## 2013-12-25 DIAGNOSIS — E871 Hypo-osmolality and hyponatremia: Principal | ICD-10-CM | POA: Diagnosis present

## 2013-12-25 DIAGNOSIS — A419 Sepsis, unspecified organism: Secondary | ICD-10-CM | POA: Diagnosis not present

## 2013-12-25 DIAGNOSIS — Z87891 Personal history of nicotine dependence: Secondary | ICD-10-CM

## 2013-12-25 DIAGNOSIS — I10 Essential (primary) hypertension: Secondary | ICD-10-CM | POA: Diagnosis present

## 2013-12-25 DIAGNOSIS — R5381 Other malaise: Secondary | ICD-10-CM | POA: Diagnosis present

## 2013-12-25 DIAGNOSIS — R197 Diarrhea, unspecified: Secondary | ICD-10-CM | POA: Diagnosis present

## 2013-12-25 DIAGNOSIS — N390 Urinary tract infection, site not specified: Secondary | ICD-10-CM | POA: Diagnosis present

## 2013-12-25 DIAGNOSIS — Z952 Presence of prosthetic heart valve: Secondary | ICD-10-CM | POA: Diagnosis not present

## 2013-12-25 DIAGNOSIS — J690 Pneumonitis due to inhalation of food and vomit: Secondary | ICD-10-CM | POA: Diagnosis not present

## 2013-12-25 DIAGNOSIS — R112 Nausea with vomiting, unspecified: Secondary | ICD-10-CM | POA: Diagnosis not present

## 2013-12-25 DIAGNOSIS — Z951 Presence of aortocoronary bypass graft: Secondary | ICD-10-CM | POA: Diagnosis not present

## 2013-12-25 DIAGNOSIS — Z950 Presence of cardiac pacemaker: Secondary | ICD-10-CM | POA: Diagnosis present

## 2013-12-25 DIAGNOSIS — R6521 Severe sepsis with septic shock: Secondary | ICD-10-CM | POA: Diagnosis not present

## 2013-12-25 DIAGNOSIS — Z8249 Family history of ischemic heart disease and other diseases of the circulatory system: Secondary | ICD-10-CM | POA: Diagnosis not present

## 2013-12-25 DIAGNOSIS — Z66 Do not resuscitate: Secondary | ICD-10-CM | POA: Diagnosis present

## 2013-12-25 DIAGNOSIS — Z953 Presence of xenogenic heart valve: Secondary | ICD-10-CM

## 2013-12-25 DIAGNOSIS — R5383 Other fatigue: Secondary | ICD-10-CM

## 2013-12-25 DIAGNOSIS — E785 Hyperlipidemia, unspecified: Secondary | ICD-10-CM | POA: Diagnosis present

## 2013-12-25 DIAGNOSIS — I5031 Acute diastolic (congestive) heart failure: Secondary | ICD-10-CM | POA: Diagnosis present

## 2013-12-25 DIAGNOSIS — Z881 Allergy status to other antibiotic agents status: Secondary | ICD-10-CM | POA: Diagnosis not present

## 2013-12-25 DIAGNOSIS — R6520 Severe sepsis without septic shock: Secondary | ICD-10-CM

## 2013-12-25 DIAGNOSIS — F411 Generalized anxiety disorder: Secondary | ICD-10-CM | POA: Diagnosis present

## 2013-12-25 DIAGNOSIS — I509 Heart failure, unspecified: Secondary | ICD-10-CM | POA: Diagnosis present

## 2013-12-25 DIAGNOSIS — Z7982 Long term (current) use of aspirin: Secondary | ICD-10-CM | POA: Diagnosis not present

## 2013-12-25 LAB — CBC WITH DIFFERENTIAL/PLATELET
Basophils Absolute: 0 10*3/uL (ref 0.0–0.1)
Basophils Relative: 0 % (ref 0–1)
Eosinophils Absolute: 0 10*3/uL (ref 0.0–0.7)
Eosinophils Relative: 0 % (ref 0–5)
HEMATOCRIT: 39.1 % (ref 36.0–46.0)
HEMOGLOBIN: 14.3 g/dL (ref 12.0–15.0)
Lymphocytes Relative: 18 % (ref 12–46)
Lymphs Abs: 1.3 10*3/uL (ref 0.7–4.0)
MCH: 36.6 pg — ABNORMAL HIGH (ref 26.0–34.0)
MCHC: 36.6 g/dL — AB (ref 30.0–36.0)
MCV: 85.7 fL (ref 78.0–100.0)
MONOS PCT: 12 % (ref 3–12)
Monocytes Absolute: 0.9 10*3/uL (ref 0.1–1.0)
Neutro Abs: 5.1 10*3/uL (ref 1.7–7.7)
Neutrophils Relative %: 69 % (ref 43–77)
Platelets: 283 10*3/uL (ref 150–400)
RBC: 4.56 MIL/uL (ref 3.87–5.11)
RDW: 13.3 % (ref 11.5–15.5)
WBC: 7.3 10*3/uL (ref 4.0–10.5)

## 2013-12-25 LAB — COMPREHENSIVE METABOLIC PANEL
ALK PHOS: 71 U/L (ref 39–117)
ALT: 16 U/L (ref 0–35)
ANION GAP: 14 (ref 5–15)
AST: 27 U/L (ref 0–37)
Albumin: 4.8 g/dL (ref 3.5–5.2)
BILIRUBIN TOTAL: 0.7 mg/dL (ref 0.3–1.2)
BUN: 9 mg/dL (ref 6–23)
CHLORIDE: 82 meq/L — AB (ref 96–112)
CO2: 25 mEq/L (ref 19–32)
CREATININE: 0.79 mg/dL (ref 0.50–1.10)
Calcium: 9.6 mg/dL (ref 8.4–10.5)
GFR, EST AFRICAN AMERICAN: 89 mL/min — AB (ref >90)
GFR, EST NON AFRICAN AMERICAN: 77 mL/min — AB (ref >90)
GLUCOSE: 102 mg/dL — AB (ref 70–99)
POTASSIUM: 3.9 meq/L (ref 3.7–5.3)
Sodium: 121 mEq/L — CL (ref 137–147)
Total Protein: 7.6 g/dL (ref 6.0–8.3)

## 2013-12-25 LAB — URINE MICROSCOPIC-ADD ON

## 2013-12-25 LAB — URINALYSIS, ROUTINE W REFLEX MICROSCOPIC
BILIRUBIN URINE: NEGATIVE
GLUCOSE, UA: NEGATIVE mg/dL
Nitrite: NEGATIVE
PH: 7 (ref 5.0–8.0)
Protein, ur: NEGATIVE mg/dL
Urobilinogen, UA: 0.2 mg/dL (ref 0.0–1.0)

## 2013-12-25 LAB — BASIC METABOLIC PANEL
Anion gap: 12 (ref 5–15)
BUN: 8 mg/dL (ref 6–23)
CO2: 21 meq/L (ref 19–32)
Calcium: 8.2 mg/dL — ABNORMAL LOW (ref 8.4–10.5)
Chloride: 94 mEq/L — ABNORMAL LOW (ref 96–112)
Creatinine, Ser: 0.69 mg/dL (ref 0.50–1.10)
GFR calc Af Amer: >90 mL/min (ref >90)
GFR calc non Af Amer: 81 mL/min — ABNORMAL LOW (ref >90)
Glucose, Bld: 100 mg/dL — ABNORMAL HIGH (ref 70–99)
POTASSIUM: 3.7 meq/L (ref 3.7–5.3)
SODIUM: 127 meq/L — AB (ref 137–147)

## 2013-12-25 LAB — SODIUM, URINE, RANDOM: Sodium, Ur: 25 mEq/L

## 2013-12-25 LAB — I-STAT CG4 LACTIC ACID, ED: LACTIC ACID, VENOUS: 0.93 mmol/L (ref 0.5–2.2)

## 2013-12-25 LAB — PRO B NATRIURETIC PEPTIDE: PRO B NATRI PEPTIDE: 1568 pg/mL — AB (ref 0–450)

## 2013-12-25 LAB — I-STAT TROPONIN, ED: TROPONIN I, POC: 0.02 ng/mL (ref 0.00–0.08)

## 2013-12-25 LAB — LIPASE, BLOOD: Lipase: 55 U/L (ref 11–59)

## 2013-12-25 LAB — TROPONIN I: Troponin I: <0.3 ng/mL (ref <0.30)

## 2013-12-25 MED ORDER — SODIUM CHLORIDE 0.9 % IV SOLN
1000.0000 mL | INTRAVENOUS | Status: DC
  Administered 2013-12-25 – 2013-12-26 (×3): 1000 mL via INTRAVENOUS

## 2013-12-25 MED ORDER — ENOXAPARIN SODIUM 40 MG/0.4ML ~~LOC~~ SOLN
40.0000 mg | SUBCUTANEOUS | Status: DC
  Administered 2013-12-25 – 2013-12-27 (×3): 40 mg via SUBCUTANEOUS
  Filled 2013-12-25 (×4): qty 0.4

## 2013-12-25 MED ORDER — FENTANYL CITRATE 0.05 MG/ML IJ SOLN
50.0000 ug | Freq: Once | INTRAMUSCULAR | Status: AC
  Administered 2013-12-25: 50 ug via INTRAVENOUS
  Filled 2013-12-25: qty 2

## 2013-12-25 MED ORDER — GI COCKTAIL ~~LOC~~
30.0000 mL | Freq: Once | ORAL | Status: AC
  Administered 2013-12-25: 30 mL via ORAL
  Filled 2013-12-25: qty 30

## 2013-12-25 MED ORDER — SODIUM CHLORIDE 0.9 % IV BOLUS (SEPSIS)
30.0000 mL/kg | Freq: Once | INTRAVENOUS | Status: AC
  Administered 2013-12-25: 1593 mL via INTRAVENOUS

## 2013-12-25 MED ORDER — BUDESONIDE-FORMOTEROL FUMARATE 160-4.5 MCG/ACT IN AERO
2.0000 | INHALATION_SPRAY | Freq: Two times a day (BID) | RESPIRATORY_TRACT | Status: DC
  Administered 2013-12-26 – 2013-12-27 (×3): 2 via RESPIRATORY_TRACT
  Filled 2013-12-25: qty 6

## 2013-12-25 MED ORDER — FAMOTIDINE IN NACL 20-0.9 MG/50ML-% IV SOLN
20.0000 mg | Freq: Once | INTRAVENOUS | Status: AC
  Administered 2013-12-25: 20 mg via INTRAVENOUS
  Filled 2013-12-25: qty 50

## 2013-12-25 MED ORDER — ACETAMINOPHEN 500 MG PO TABS: 500.0000 mg | ORAL_TABLET | Freq: Every day | ORAL | Status: DC | PRN

## 2013-12-25 MED ORDER — ONDANSETRON HCL 4 MG/2ML IJ SOLN
4.0000 mg | Freq: Four times a day (QID) | INTRAMUSCULAR | Status: DC | PRN
  Administered 2013-12-27: 4 mg via INTRAVENOUS
  Filled 2013-12-25: qty 2

## 2013-12-25 MED ORDER — BUDESONIDE-FORMOTEROL FUMARATE 160-4.5 MCG/ACT IN AERO
INHALATION_SPRAY | RESPIRATORY_TRACT | Status: AC
  Filled 2013-12-25: qty 6

## 2013-12-25 MED ORDER — PIPERACILLIN-TAZOBACTAM 3.375 G IVPB
3.3750 g | Freq: Three times a day (TID) | INTRAVENOUS | Status: DC
Start: 2013-12-25 — End: 2013-12-28
  Administered 2013-12-25 – 2013-12-28 (×8): 3.375 g via INTRAVENOUS
  Filled 2013-12-25 (×12): qty 50

## 2013-12-25 MED ORDER — FLUTICASONE PROPIONATE 50 MCG/ACT NA SUSP
2.0000 | Freq: Every day | NASAL | Status: DC
  Administered 2013-12-26 – 2013-12-27 (×2): 2 via NASAL
  Filled 2013-12-25: qty 16

## 2013-12-25 MED ORDER — ASPIRIN EC 81 MG PO TBEC
81.0000 mg | DELAYED_RELEASE_TABLET | Freq: Every day | ORAL | Status: DC
  Administered 2013-12-25 – 2013-12-27 (×3): 81 mg via ORAL
  Filled 2013-12-25 (×3): qty 1

## 2013-12-25 MED ORDER — LORAZEPAM 1 MG PO TABS
1.0000 mg | ORAL_TABLET | Freq: Four times a day (QID) | ORAL | Status: DC | PRN
  Administered 2013-12-25 – 2013-12-26 (×2): 1 mg via ORAL
  Filled 2013-12-25 (×2): qty 1

## 2013-12-25 MED ORDER — SODIUM CHLORIDE 0.9 % IV SOLN
INTRAVENOUS | Status: AC
  Administered 2013-12-25: 21:00:00 via INTRAVENOUS

## 2013-12-25 MED ORDER — METOPROLOL TARTRATE 50 MG PO TABS
50.0000 mg | ORAL_TABLET | Freq: Every day | ORAL | Status: DC
  Administered 2013-12-26 – 2013-12-27 (×2): 50 mg via ORAL
  Filled 2013-12-25 (×2): qty 1

## 2013-12-25 MED ORDER — SODIUM CHLORIDE 0.9 % IJ SOLN
3.0000 mL | Freq: Two times a day (BID) | INTRAMUSCULAR | Status: DC
  Administered 2013-12-26 – 2013-12-27 (×3): 3 mL via INTRAVENOUS

## 2013-12-25 MED ORDER — PIPERACILLIN-TAZOBACTAM 3.375 G IVPB
INTRAVENOUS | Status: AC
Start: 2013-12-25 — End: 2013-12-25
  Filled 2013-12-25: qty 100

## 2013-12-25 MED ORDER — ONDANSETRON HCL 4 MG PO TABS: 4.0000 mg | ORAL_TABLET | Freq: Four times a day (QID) | ORAL | Status: DC | PRN

## 2013-12-25 NOTE — ED Notes (Signed)
CRITICAL VALUE ALERT  Critical value received:  Na 121  Date of notification:  01/18/2014  Time of notification:  1945  Critical value read back:Yes.    Nurse who received alert:  Jasmine DecemberSharon, RN  MD notified (1st page):  Bednar  Time of first page:  1945  MD notified (2nd page):  Time of second page:  Responding MD:  Fonnie JarvisBednar  Time MD responded:  1945

## 2013-12-25 NOTE — ED Provider Notes (Signed)
CSN: [id number]     Arrival date & time 01/02/2014  1755 History  This chart was scribed for Hurman HornJohn M Joleena Weisenburger, MD by Leona CarryG. Clay Sherrill, ED Scribe. The patient was seen in APA12/APA12. The patient's care was started at 6:15 PM.     Chief Complaint  Patient presents with  . Weakness   Patient is a 80 y.o. female presenting with weakness. The history is provided by the patient. No language interpreter was used.  Weakness   HPI Comments: [name] is a 80 y.o. female with a history of aortic valve replacement (in 2005) who presents to the Emergency Department complaining of several days of generalized weakness (worse than baseline) SOB and cough, and congestion beginning one week ago. Patient also reports intermittent abdominal pain and an intermittent "burning" pain through her chest and abdomen for the past week that lasts hours at a time and is nonexertional. She reports having several loose stools per day. Patient's daughter denies Pt confusion. Patient was on Zithromax within last few weeks for cough.  At baseline, patient lives alone and uses a walker for safety. Her son lives next door. PCP is Dr. Juanetta GoslingHawkins.   Past Medical History  Diagnosis Date  . Hypertension   . Hyperlipidemia   . CAD (coronary artery disease)     CABG 01/05/04-LIMA to LAD  . Aortic stenosis     AOV relacement bovine tissue valve 01/05/04  . CHB (complete heart block)     Permanent Pacemaker 01/11/04- St.Jude  . Bronchitis    Past Surgical History  Procedure Laterality Date  . Coronary artery bypass graft  01/05/04    LIMA to LAD  . Aortic valve replacement  01/05/04    Bovine tissue valve  . Permanent pacemaker insertion  01/11/04    St. Jude  . Sternal wound debridement  01/16/04    sternal rewiring  . [place] echocardiography  12/31/2011    moderate LAE,stage I diastolic dysfunction,trace MR,TR, bioprosthetic AOV   Family History  Problem Relation Age of Onset  . Heart failure Mother   . Cancer Brother     lung    History  Substance Use Topics  . Smoking status: Former Smoker    Quit date: 05/25/1974  . Smokeless tobacco: Not on file  . Alcohol Use: No   OB History   Grav Para Term Preterm Abortions TAB SAB Ect Mult Living                 Review of Systems  Neurological: Positive for weakness.   10 Systems reviewed and are negative for acute change except as noted in the HPI.    Allergies  Altace; Ceftin; Crestor; and Zocor  Home Medications   Prior to Admission medications   Medication Sig Start Date End Date Taking? Authorizing Provider  aspirin EC 81 MG tablet Take 40.5 mg by mouth daily.   Yes Historical Provider, MD  budesonide-formoterol (SYMBICORT) 160-4.5 MCG/ACT inhaler Inhale 2 puffs into the lungs 2 (two) times daily.   Yes Historical Provider, MD  fluticasone (FLONASE) 50 MCG/ACT nasal spray Place 2 sprays into both nostrils daily.   Yes Historical Provider, MD  LORazepam (ATIVAN) 1 MG tablet Take 1 mg by mouth 4 (four) times daily as needed for anxiety.    Yes Historical Provider, MD  metoprolol (LOPRESSOR) 50 MG tablet Take 50 mg by mouth daily.   Yes Historical Provider, MD  Multiple Vitamin (MULTIVITAMIN WITH MINERALS) TABS tablet Take 1 tablet by  mouth daily.   Yes Historical Provider, MD  nitrofurantoin, macrocrystal-monohydrate, (MACROBID) 100 MG capsule Take 100 mg by mouth 2 (two) times daily. For 7 days(started on 2013-12-26) 12-26-2013  Yes Historical Provider, MD  sulfamethoxazole-trimethoprim (BACTRIM DS) 800-160 MG per tablet Take 1 tablet by mouth 2 (two) times daily. Stopped on 2013/12/26 12/21/13  Yes Historical Provider, MD  telmisartan (MICARDIS) 80 MG tablet Take 40 mg by mouth daily.   Yes Historical Provider, MD  traMADol (ULTRAM) 50 MG tablet Take 50-100 mg by mouth 4 (four) times daily as needed. pain   Yes Historical Provider, MD  acetaminophen (TYLENOL) 500 MG tablet Take 500 mg by mouth daily as needed for moderate pain.    Historical Provider, MD   Triage  Vitals: BP 118/71  Pulse 107  Temp(Src) 97.9 F (36.6 C) (Oral)  Resp 20  SpO2 98% Physical Exam  Nursing note and vitals reviewed. Constitutional:  Awake, alert, nontoxic appearance.  HENT:  Head: Atraumatic.  Eyes: Right eye exhibits no discharge. Left eye exhibits no discharge.  Neck: Neck supple.  Cardiovascular: Regular rhythm.   Murmur (Soft, systolic murmur) heard. Pulmonary/Chest: Effort normal and breath sounds normal. She has no wheezes. She has no rales. She exhibits no tenderness.  No crackles.  Abdominal: Soft. Bowel sounds are normal. She exhibits no distension. There is tenderness (minimal diffuse tenderness). There is no rebound.  Musculoskeletal: She exhibits no tenderness.  Baseline ROM, no obvious new focal weakness.  Neurological:  Mental status and motor strength appears baseline for patient and situation.  Skin: No rash noted.  Psychiatric: She has a normal mood and affect.    ED Course  Procedures (including critical care time) DIAGNOSTIC STUDIES: Oxygen Saturation is 98% on room air, normal by my interpretation.    COORDINATION OF CARE: 6:24 PM-Discussed treatment plan which includes UA and labs with pt at bedside and pt agreed to plan.    D/w [facility] for admit. 2015 Labs Review Labs Reviewed  URINALYSIS, ROUTINE W REFLEX MICROSCOPIC - Abnormal; Notable for the following:    Specific Gravity, Urine <1.005 (*)    Hgb urine dipstick MODERATE (*)    Ketones, ur TRACE (*)    Leukocytes, UA TRACE (*)    All other components within normal limits  CBC WITH DIFFERENTIAL - Abnormal; Notable for the following:    MCH 36.6 (*)    MCHC 36.6 (*)    All other components within normal limits  COMPREHENSIVE METABOLIC PANEL - Abnormal; Notable for the following:    Sodium 121 (*)    Chloride 82 (*)    Glucose, Bld 102 (*)    GFR calc non Af Amer 77 (*)    GFR calc Af Amer 89 (*)    All other components within normal limits  PRO B NATRIURETIC PEPTIDE -  Abnormal; Notable for the following:    Pro B Natriuretic peptide (BNP) 1568.0 (*)    All other components within normal limits  BASIC METABOLIC PANEL - Abnormal; Notable for the following:    Sodium 127 (*)    Chloride 94 (*)    Glucose, Bld 100 (*)    Calcium 8.2 (*)    GFR calc non Af Amer 81 (*)    All other components within normal limits  COMPREHENSIVE METABOLIC PANEL - Abnormal; Notable for the following:    Sodium 133 (*)    GFR calc non Af Amer 79 (*)    All other components within normal limits  URINE CULTURE  LIPASE, BLOOD  SODIUM, URINE, RANDOM  URINE MICROSCOPIC-ADD ON  CBC  TROPONIN I  TROPONIN I  OSMOLALITY  OSMOLALITY, URINE  TROPONIN I  I-STAT CG4 LACTIC ACID, ED  Rosezena Sensor, ED    Imaging Review Dg Chest Port 1 View  12/29/2013   CLINICAL DATA:  Weakness.  Urinary tract infection.  EXAM: PORTABLE CHEST - 1 VIEW  COMPARISON:  12/17/2013.  FINDINGS: The heart remains normal in size. No significant change in dense opacity at the left lung base. The right lung remains clear. Stable prominence of the interstitial markings. Stable median sternotomy wires and prosthetic heart valve. Stable right subclavian pacemaker leads. Diffuse osteopenia.  IMPRESSION: 1. No significant change in dense left basilar atelectasis or pneumonia with underlying elevation of the left hemidiaphragm. 2. Stable chronic interstitial lung disease.   Electronically Signed   By: Gordan Payment M.D.   On: December 29, 2013 18:56     EKG Interpretation None      MDM   Final diagnoses:  Hyponatremia    The patient appears reasonably stabilized for admission considering the current resources, flow, and capabilities available in the ED at this time, and I doubt any other [facility] requiring further screening and/or treatment in the ED prior to admission.  This chart was scribed in my presence and reviewed by me personally.   Hurman Horn, MD 12/26/13 [phone]

## 2013-12-25 NOTE — H&P (Signed)
PCP:   Fredirick MaudlinHAWKINS,EDWARD L, MD   Chief Complaint:  Generalized weakness  HPI: 80 year old female who   has a past medical history of Hypertension; Hyperlipidemia; CAD (coronary artery disease); Aortic stenosis; CHB (complete heart block); and Bronchitis. Today was brought to the ED with several days of generalized weakness, cough, shortness of breath. Patient also developed diarrhea today with several loose bowel movements. Patient was diagnosed with UTI and was prescribed Bactrim by her PCP Dr. Juanetta GoslingHawkins on Wednesday, July 29, yesterday the culture results came back in the office called the patient to change the antibiotic to Macrobid. Today patient took one tablet of Macrobid, and has been having worsening diarrhea and weakness. She also has been complaining of dysuria, shivering(requiring multiple blankets in the ED) Lab work in the ED revealed sodium 121, her last sodium on 7/25 was 129. urine analysis shows 7-10 WBCs per high-power field.  Allergies:   Allergies  Allergen Reactions  . Altace [Ramipril] Cough  . Crestor [Rosuvastatin]     myalgias  . Zocor [Simvastatin] Other (See Comments)    Myalgias Blurred vision       Past Medical History  Diagnosis Date  . Hypertension   . Hyperlipidemia   . CAD (coronary artery disease)     CABG 01/05/04-LIMA to LAD  . Aortic stenosis     AOV relacement bovine tissue valve 01/05/04  . CHB (complete heart block)     Permanent Pacemaker 01/11/04- [employer]Jude  . Bronchitis     Past Surgical History  Procedure Laterality Date  . Coronary artery bypass graft  01/05/04    LIMA to LAD  . Aortic valve replacement  01/05/04    Bovine tissue valve  . Permanent pacemaker insertion  01/11/04    [employer] Jude  . Sternal wound debridement  01/16/04    sternal rewiring  . [place] echocardiography  12/31/2011    moderate LAE,stage I diastolic dysfunction,trace MR,TR, bioprosthetic AOV    Prior to Admission medications   Medication Sig Start Date End Date  Taking? Authorizing Provider  aspirin EC 81 MG tablet Take 40.5 mg by mouth daily.   Yes Historical Provider, MD  budesonide-formoterol (SYMBICORT) 160-4.5 MCG/ACT inhaler Inhale 2 puffs into the lungs 2 (two) times daily.   Yes Historical Provider, MD  fluticasone (FLONASE) 50 MCG/ACT nasal spray Place 2 sprays into both nostrils daily.   Yes Historical Provider, MD  LORazepam (ATIVAN) 1 MG tablet Take 1 mg by mouth 4 (four) times daily as needed for anxiety.    Yes Historical Provider, MD  metoprolol (LOPRESSOR) 50 MG tablet Take 50 mg by mouth daily.   Yes Historical Provider, MD  Multiple Vitamin (MULTIVITAMIN WITH MINERALS) TABS tablet Take 1 tablet by mouth daily.   Yes Historical Provider, MD  nitrofurantoin, macrocrystal-monohydrate, (MACROBID) 100 MG capsule Take 100 mg by mouth 2 (two) times daily. For 7 days(started on 01/01/2014) 01/10/2014  Yes Historical Provider, MD  sulfamethoxazole-trimethoprim (BACTRIM DS) 800-160 MG per tablet Take 1 tablet by mouth 2 (two) times daily. Stopped on 12/24/2013 12/21/13  Yes Historical Provider, MD  telmisartan (MICARDIS) 80 MG tablet Take 40 mg by mouth daily.   Yes Historical Provider, MD  traMADol (ULTRAM) 50 MG tablet Take 50-100 mg by mouth 4 (four) times daily as needed. pain   Yes Historical Provider, MD  acetaminophen (TYLENOL) 500 MG tablet Take 500 mg by mouth daily as needed for moderate pain.    Historical Provider, MD    Social History:  reports that she quit smoking about 39 years ago. She does not have any smokeless tobacco history on file. She reports that she does not drink alcohol or use illicit drugs.  Family History  Problem Relation Age of Onset  . Heart failure Mother   . Cancer Brother     lung     All the positives are listed in BOLD  Review of Systems:  HEENT: Headache, blurred vision, runny nose, sore throat Neck: Hypothyroidism, hyperthyroidism,,lymphadenopathy Chest : Shortness of breath, history of COPD, Asthma Heart :  Chest pain, history of coronary arterey disease GI:  Nausea, vomiting, diarrhea, constipation, GERD GU: Dysuria, urgency, frequency of urination, hematuria Neuro: Stroke, seizures, syncope Psych: Depression, anxiety, hallucinations   Physical Exam: Blood pressure 147/91, pulse 76, temperature 97.9 F (36.6 C), temperature source Oral, resp. rate 18, weight 53.1 kg (117 lb 1 oz), SpO2 99.00%. Constitutional:   Patient is a well-developed and well-nourished female, shivering but cooperative with exam. Head: Normocephalic and atraumatic Mouth: Mucus membranes moist Eyes: PERRL, EOMI, conjunctivae normal Neck: Supple, No Thyromegaly Cardiovascular: RRR, S1 normal, S2 normal Pulmonary/Chest: CTAB, no wheezes, rales, or rhonchi Abdominal: Soft. Non-tender, non-distended, bowel sounds are normal, no masses, organomegaly, or guarding present.  Neurological: A&O x3, Strenght is normal and symmetric bilaterally, cranial nerve II-XII are grossly intact, no focal motor deficit, sensory intact to light touch bilaterally.  Extremities : No Cyanosis, Clubbing or Edema  Labs on Admission:  Basic Metabolic Panel:  Recent Labs Lab 12-31-2013 1832  NA 121*  K 3.9  CL 82*  CO2 25  GLUCOSE 102*  BUN 9  CREATININE 0.79  CALCIUM 9.6   Liver Function Tests:  Recent Labs Lab 31-Dec-2013 1832  AST 27  ALT 16  ALKPHOS 71  BILITOT 0.7  PROT 7.6  ALBUMIN 4.8    Recent Labs Lab 31-Dec-2013 1832  LIPASE 55   No results found for this basename: AMMONIA,  in the last 168 hours CBC:  Recent Labs Lab 12-31-13 1832  WBC 7.3  NEUTROABS 5.1  HGB 14.3  HCT 39.1  MCV 85.7  PLT 283   Cardiac Enzymes: No results found for this basename: CKTOTAL, CKMB, CKMBINDEX, TROPONINI,  in the last 168 hours  BNP (last 3 results)  Recent Labs  12/17/13 1058 12/31/13 1832  PROBNP 1082.0* 1568.0*   CBG: No results found for this basename: GLUCAP,  in the last 168 hours  Radiological Exams on  Admission: Dg Chest Port 1 View  31-Dec-2013   CLINICAL DATA:  Weakness.  Urinary tract infection.  EXAM: PORTABLE CHEST - 1 VIEW  COMPARISON:  12/17/2013.  FINDINGS: The heart remains normal in size. No significant change in dense opacity at the left lung base. The right lung remains clear. Stable prominence of the interstitial markings. Stable median sternotomy wires and prosthetic heart valve. Stable right subclavian pacemaker leads. Diffuse osteopenia.  IMPRESSION: 1. No significant change in dense left basilar atelectasis or pneumonia with underlying elevation of the left hemidiaphragm. 2. Stable chronic interstitial lung disease.   Electronically Signed   By: Gordan Payment M.D.   On: 12/31/2013 18:56       Assessment/Plan Principal Problem:   Hyponatremia Active Problems:   Pacemaker St. Jude identity XL dual-chamber implanted 2005   S/P aortic valve replacement with bioprosthetic valve   CAD (coronary artery disease)   Hyperlipidemia   UTI (lower urinary tract infection)   Hyponatremia Likely due to dehydration from diarrhea, patient did have sodium 129  on 7/25. We'll hydrate the patient, obtain serum and urine osmolality Check BMP every 8 hours  UTI Patient was recently diagnosed with UTI. Was started on Bactrim initially which was changed to [employer] and discussed the urine culture results with patient's PCP Dr. Juanetta Gosling, who says that urine culture was sensitive to Rocephin but patient had allergic to cephalosporin in the office chart, so start the patient on Zosyn. Urine culture has been obtained in the ED  Chest discomfort Patient had some chest discomfort, though she denies chest pain at this time. She has a history of CAD and status post bioprosthetic aortic valve replacement. Will cycle cardiac enzymes and obtain EKG. Continue aspirin 81 mg daily.  Hypertension Will continue metoprolol 50 mg by mouth daily.  DVT prophylaxis Lovenox  Code status: Patient is DO NOT  RESUSCITATE  Family discussion: Admission, patients condition and plan of care including tests being ordered have been discussed with the patient and *her daughters at bedside* who indicate understanding and agree with the plan and Code Status.   Time Spent on Admission: 60 minutes  Zareya Tuckett S [facility] Pager: [phone] 12/29/2013, 8:51 PM  If 7PM-7AM, please contact night-coverage  [email or web address]  Password [email or web address]

## 2013-12-25 NOTE — ED Notes (Signed)
Pt needs more urine for test

## 2013-12-25 NOTE — ED Notes (Signed)
Pt was seen in er on 12/17/2013, diagnosed with uti, was seen at Dr Juanetta GoslingHawkins office on Wednesday, family spoke with DR Juanetta GoslingHawkins today because pt has seemed to be "confused" at times, irritable, generalized weakness, complains of lower back pain and neck pain.

## 2013-12-25 NOTE — Progress Notes (Signed)
ANTIBIOTIC CONSULT NOTE - INITIAL  Pharmacy Consult for Zosyn Indication: UTI / Sepsis  Allergies  Allergen Reactions  . Altace [Ramipril] Cough  . Crestor [Rosuvastatin]     myalgias  . Zocor [Simvastatin] Other (See Comments)    Myalgias Blurred vision     Patient Measurements: Height: 5\' 2"  (157.5 cm) Weight: 121 lb 14.6 oz (55.3 kg) IBW/kg (Calculated) : 50.1  Vital Signs: Temp: 98 F (36.7 C) (08/02 2121) Temp src: Oral (08/02 2121) BP: 116/92 mmHg (08/02 2121) Pulse Rate: 70 (08/02 2121) Intake/Output from previous day:   Intake/Output from this shift:    Labs:  Recent Labs  01/20/2014 1832  WBC 7.3  HGB 14.3  PLT 283  CREATININE 0.79   Estimated Creatinine Clearance: 45.1 ml/min (by C-G formula based on Cr of 0.79). No results found for this basename: VANCOTROUGH, VANCOPEAK, VANCORANDOM, GENTTROUGH, GENTPEAK, GENTRANDOM, TOBRATROUGH, TOBRAPEAK, TOBRARND, AMIKACINPEAK, AMIKACINTROU, AMIKACIN,  in the last 72 hours   Microbiology: No results found for this or any previous visit (from the past 720 hour(s)).  Medical History: Past Medical History  Diagnosis Date  . Hypertension   . Hyperlipidemia   . CAD (coronary artery disease)     CABG 01/05/04-LIMA to LAD  . Aortic stenosis     AOV relacement bovine tissue valve 01/05/04  . CHB (complete heart block)     Permanent Pacemaker 01/11/04- St.Jude  . Bronchitis     Medications:  Scheduled:  . sodium chloride   Intravenous STAT  . aspirin EC  81 mg Oral Daily  . budesonide-formoterol  2 puff Inhalation BID  . enoxaparin (LOVENOX) injection  40 mg Subcutaneous Q24H  . [START ON 12/26/2013] fluticasone  2 spray Each Nare Daily  . [START ON 12/26/2013] metoprolol  50 mg Oral Daily  . sodium chloride  3 mL Intravenous Q12H   Assessment: Okay for Protocol, Treatment for Sepsis/UTI.  Bactrim outpatient.  Allergies to be confirmed, ? Cephalosporins ?  Zosyn >>  Goal of Therapy:  Eradicate  infection.  Plan:  Zosyn 3.375gm IV every 8 hours. Follow-up micro data, labs, vitals.  Mady GemmaHayes, Helen Winterhalter R 01/21/2014,9:29 PM

## 2013-12-26 LAB — COMPREHENSIVE METABOLIC PANEL
ALT: 14 U/L (ref 0–35)
AST: 24 U/L (ref 0–37)
Albumin: 3.9 g/dL (ref 3.5–5.2)
Alkaline Phosphatase: 56 U/L (ref 39–117)
Anion gap: 11 (ref 5–15)
BUN: 7 mg/dL (ref 6–23)
CO2: 23 mEq/L (ref 19–32)
Calcium: 8.6 mg/dL (ref 8.4–10.5)
Chloride: 99 mEq/L (ref 96–112)
Creatinine, Ser: 0.74 mg/dL (ref 0.50–1.10)
GFR calc Af Amer: >90 mL/min (ref >90)
GFR calc non Af Amer: 79 mL/min — ABNORMAL LOW (ref >90)
Glucose, Bld: 92 mg/dL (ref 70–99)
Potassium: 4.1 mEq/L (ref 3.7–5.3)
SODIUM: 133 meq/L — AB (ref 137–147)
TOTAL PROTEIN: 6.3 g/dL (ref 6.0–8.3)
Total Bilirubin: 0.6 mg/dL (ref 0.3–1.2)

## 2013-12-26 LAB — CBC
HCT: 36.5 % (ref 36.0–46.0)
HEMOGLOBIN: 12.5 g/dL (ref 12.0–15.0)
MCH: 30.3 pg (ref 26.0–34.0)
MCHC: 34.2 g/dL (ref 30.0–36.0)
MCV: 88.4 fL (ref 78.0–100.0)
Platelets: 241 10*3/uL (ref 150–400)
RBC: 4.13 MIL/uL (ref 3.87–5.11)
RDW: 13.6 % (ref 11.5–15.5)
WBC: 4.9 10*3/uL (ref 4.0–10.5)

## 2013-12-26 LAB — TROPONIN I: Troponin I: <0.3 ng/mL (ref <0.30)

## 2013-12-26 LAB — OSMOLALITY, URINE: OSMOLALITY UR: 110 mosm/kg — AB (ref 390–1090)

## 2013-12-26 LAB — OSMOLALITY: Osmolality: 252 mOsm/kg — ABNORMAL LOW (ref 275–300)

## 2013-12-26 NOTE — Progress Notes (Signed)
Subjective: She says she feels better. She was admitted with weakness urinary tract infection and hyponatremia.  Objective: Vital signs in last 24 hours: Temp:  [97.9 F (36.6 C)-98 F (36.7 C)] 98 F (36.7 C) (08/03 0657) Pulse Rate:  [70-107] 105 (08/03 0657) Resp:  [15-22] 20 (08/03 0657) BP: (116-176)/(71-92) 176/80 mmHg (08/03 0657) SpO2:  [98 %-100 %] 100 % (08/03 0657) Weight:  [53.1 kg (117 lb 1 oz)-55.3 kg (121 lb 14.6 oz)] 55.3 kg (121 lb 14.6 oz) (08/02 2121) Weight change:  Last BM Date: 12/31/2013  Intake/Output from previous day: 08/02 0701 - 08/03 0700 In: -  Out: 900 [Urine:900]  PHYSICAL EXAM General appearance: alert, cooperative and mild distress Resp: clear to auscultation bilaterally Cardio: regular rate and rhythm, S1, S2 normal, no murmur, click, rub or gallop GI: soft, non-tender; bowel sounds normal; no masses,  no organomegaly Extremities: extremities normal, atraumatic, no cyanosis or edema  Lab Results:  Results for orders placed during the hospital encounter of 01/03/2014 (from the past 48 hour(s))  CBC WITH DIFFERENTIAL     Status: Abnormal   Collection Time    01/15/2014  6:32 PM      Result Value Ref Range   WBC 7.3  4.0 - 10.5 K/uL   RBC 4.56  3.87 - 5.11 MIL/uL   Hemoglobin 14.3  12.0 - 15.0 g/dL   HCT 39.1  36.0 - 46.0 %   MCV 85.7  78.0 - 100.0 fL   MCH 36.6 (*) 26.0 - 34.0 pg   MCHC 36.6 (*) 30.0 - 36.0 g/dL   RDW 13.3  11.5 - 15.5 %   Platelets 283  150 - 400 K/uL   Neutrophils Relative % 69  43 - 77 %   Neutro Abs 5.1  1.7 - 7.7 K/uL   Lymphocytes Relative 18  12 - 46 %   Lymphs Abs 1.3  0.7 - 4.0 K/uL   Monocytes Relative 12  3 - 12 %   Monocytes Absolute 0.9  0.1 - 1.0 K/uL   Eosinophils Relative 0  0 - 5 %   Eosinophils Absolute 0.0  0.0 - 0.7 K/uL   Basophils Relative 0  0 - 1 %   Basophils Absolute 0.0  0.0 - 0.1 K/uL  COMPREHENSIVE METABOLIC PANEL     Status: Abnormal   Collection Time    01/06/2014  6:32 PM      Result  Value Ref Range   Sodium 121 (*) 137 - 147 mEq/L   Comment: REPEATED TO VERIFY     CRITICAL RESULT CALLED TO, READ BACK BY AND VERIFIED WITH:     MOORE,S 7:40PM ON 01/06/2014 BY MOSLEY,J   Potassium 3.9  3.7 - 5.3 mEq/L   Chloride 82 (*) 96 - 112 mEq/L   CO2 25  19 - 32 mEq/L   Glucose, Bld 102 (*) 70 - 99 mg/dL   BUN 9  6 - 23 mg/dL   Creatinine, Ser 0.79  0.50 - 1.10 mg/dL   Calcium 9.6  8.4 - 10.5 mg/dL   Total Protein 7.6  6.0 - 8.3 g/dL   Albumin 4.8  3.5 - 5.2 g/dL   AST 27  0 - 37 U/L   ALT 16  0 - 35 U/L   Alkaline Phosphatase 71  39 - 117 U/L   Total Bilirubin 0.7  0.3 - 1.2 mg/dL   GFR calc non Af Amer 77 (*) >90 mL/min   GFR calc Af Amer 89 (*) >  90 mL/min   Comment: (NOTE)     The eGFR has been calculated using the CKD EPI equation.     This calculation has not been validated in all clinical situations.     eGFR's persistently <90 mL/min signify possible Chronic Kidney     Disease.   Anion gap 14  5 - 15  LIPASE, BLOOD     Status: None   Collection Time    01/15/2014  6:32 PM      Result Value Ref Range   Lipase 55  11 - 59 U/L  PRO B NATRIURETIC PEPTIDE     Status: Abnormal   Collection Time    01/21/2014  6:32 PM      Result Value Ref Range   Pro B Natriuretic peptide (BNP) 1568.0 (*) 0 - 450 pg/mL  I-STAT TROPOININ, ED     Status: None   Collection Time    01/20/2014  6:54 PM      Result Value Ref Range   Troponin i, poc 0.02  0.00 - 0.08 ng/mL   Comment 3            Comment: Due to the release kinetics of cTnI,     a negative result within the first hours     of the onset of symptoms does not rule out     myocardial infarction with certainty.     If myocardial infarction is still suspected,     repeat the test at appropriate intervals.  I-STAT CG4 LACTIC ACID, ED     Status: None   Collection Time    12/24/2013  6:56 PM      Result Value Ref Range   Lactic Acid, Venous 0.93  0.5 - 2.2 mmol/L  URINALYSIS, ROUTINE W REFLEX MICROSCOPIC     Status: Abnormal    Collection Time    01/08/2014  7:34 PM      Result Value Ref Range   Color, Urine YELLOW  YELLOW   APPearance CLEAR  CLEAR   Specific Gravity, Urine <1.005 (*) 1.005 - 1.030   pH 7.0  5.0 - 8.0   Glucose, UA NEGATIVE  NEGATIVE mg/dL   Hgb urine dipstick MODERATE (*) NEGATIVE   Bilirubin Urine NEGATIVE  NEGATIVE   Ketones, ur TRACE (*) NEGATIVE mg/dL   Protein, ur NEGATIVE  NEGATIVE mg/dL   Urobilinogen, UA 0.2  0.0 - 1.0 mg/dL   Nitrite NEGATIVE  NEGATIVE   Leukocytes, UA TRACE (*) NEGATIVE  URINE MICROSCOPIC-ADD ON     Status: None   Collection Time    01/19/2014  7:34 PM      Result Value Ref Range   Squamous Epithelial / LPF RARE  RARE   WBC, UA 7-10  <3 WBC/hpf   RBC / HPF 3-6  <3 RBC/hpf   Bacteria, UA RARE  RARE  SODIUM, URINE, RANDOM     Status: None   Collection Time    01/15/2014  8:10 PM      Result Value Ref Range   Sodium, Ur 25    BASIC METABOLIC PANEL     Status: Abnormal   Collection Time    01/18/2014  9:19 PM      Result Value Ref Range   Sodium 127 (*) 137 - 147 mEq/L   Potassium 3.7  3.7 - 5.3 mEq/L   Chloride 94 (*) 96 - 112 mEq/L   Comment: DELTA CHECK NOTED   CO2 21  19 - 32 mEq/L  Glucose, Bld 100 (*) 70 - 99 mg/dL   BUN 8  6 - 23 mg/dL   Creatinine, Ser 0.69  0.50 - 1.10 mg/dL   Calcium 8.2 (*) 8.4 - 10.5 mg/dL   GFR calc non Af Amer 81 (*) >90 mL/min   GFR calc Af Amer >90  >90 mL/min   Comment: (NOTE)     The eGFR has been calculated using the CKD EPI equation.     This calculation has not been validated in all clinical situations.     eGFR's persistently <90 mL/min signify possible Chronic Kidney     Disease.   Anion gap 12  5 - 15  TROPONIN I     Status: None   Collection Time    12/31/2013  9:19 PM      Result Value Ref Range   Troponin I <0.30  <0.30 ng/mL   Comment:            Due to the release kinetics of cTnI,     a negative result within the first hours     of the onset of symptoms does not rule out     myocardial infarction with  certainty.     If myocardial infarction is still suspected,     repeat the test at appropriate intervals.  CBC     Status: None   Collection Time    12/26/13  3:38 AM      Result Value Ref Range   WBC 4.9  4.0 - 10.5 K/uL   RBC 4.13  3.87 - 5.11 MIL/uL   Hemoglobin 12.5  12.0 - 15.0 g/dL   HCT 36.5  36.0 - 46.0 %   MCV 88.4  78.0 - 100.0 fL   MCH 30.3  26.0 - 34.0 pg   MCHC 34.2  30.0 - 36.0 g/dL   RDW 13.6  11.5 - 15.5 %   Platelets 241  150 - 400 K/uL  COMPREHENSIVE METABOLIC PANEL     Status: Abnormal   Collection Time    12/26/13  3:38 AM      Result Value Ref Range   Sodium 133 (*) 137 - 147 mEq/L   Potassium 4.1  3.7 - 5.3 mEq/L   Chloride 99  96 - 112 mEq/L   CO2 23  19 - 32 mEq/L   Glucose, Bld 92  70 - 99 mg/dL   BUN 7  6 - 23 mg/dL   Creatinine, Ser 0.74  0.50 - 1.10 mg/dL   Calcium 8.6  8.4 - 10.5 mg/dL   Total Protein 6.3  6.0 - 8.3 g/dL   Albumin 3.9  3.5 - 5.2 g/dL   AST 24  0 - 37 U/L   ALT 14  0 - 35 U/L   Alkaline Phosphatase 56  39 - 117 U/L   Total Bilirubin 0.6  0.3 - 1.2 mg/dL   GFR calc non Af Amer 79 (*) >90 mL/min   GFR calc Af Amer >90  >90 mL/min   Comment: (NOTE)     The eGFR has been calculated using the CKD EPI equation.     This calculation has not been validated in all clinical situations.     eGFR's persistently <90 mL/min signify possible Chronic Kidney     Disease.   Anion gap 11  5 - 15  TROPONIN I     Status: None   Collection Time    12/26/13  3:38 AM  Result Value Ref Range   Troponin I <0.30  <0.30 ng/mL   Comment:            Due to the release kinetics of cTnI,     a negative result within the first hours     of the onset of symptoms does not rule out     myocardial infarction with certainty.     If myocardial infarction is still suspected,     repeat the test at appropriate intervals.    ABGS No results found for this basename: PHART, PCO2, PO2ART, TCO2, HCO3,  in the last 72 hours CULTURES No results found for  this or any previous visit (from the past 240 hour(s)). Studies/Results: Dg Chest Port 1 View  01/20/2014   CLINICAL DATA:  Weakness.  Urinary tract infection.  EXAM: PORTABLE CHEST - 1 VIEW  COMPARISON:  12/17/2013.  FINDINGS: The heart remains normal in size. No significant change in dense opacity at the left lung base. The right lung remains clear. Stable prominence of the interstitial markings. Stable median sternotomy wires and prosthetic heart valve. Stable right subclavian pacemaker leads. Diffuse osteopenia.  IMPRESSION: 1. No significant change in dense left basilar atelectasis or pneumonia with underlying elevation of the left hemidiaphragm. 2. Stable chronic interstitial lung disease.   Electronically Signed   By: Enrique Sack M.D.   On: 01/16/2014 18:56    Medications:  Prior to Admission:  Prescriptions prior to admission  Medication Sig Dispense Refill  . aspirin EC 81 MG tablet Take 40.5 mg by mouth daily.      . budesonide-formoterol (SYMBICORT) 160-4.5 MCG/ACT inhaler Inhale 2 puffs into the lungs 2 (two) times daily.      . fluticasone (FLONASE) 50 MCG/ACT nasal spray Place 2 sprays into both nostrils daily.      Marland Kitchen LORazepam (ATIVAN) 1 MG tablet Take 1 mg by mouth 4 (four) times daily as needed for anxiety.       . metoprolol (LOPRESSOR) 50 MG tablet Take 50 mg by mouth daily.      . Multiple Vitamin (MULTIVITAMIN WITH MINERALS) TABS tablet Take 1 tablet by mouth daily.      . nitrofurantoin, macrocrystal-monohydrate, (MACROBID) 100 MG capsule Take 100 mg by mouth 2 (two) times daily. For 7 days(started on 01/10/2014)      . sulfamethoxazole-trimethoprim (BACTRIM DS) 800-160 MG per tablet Take 1 tablet by mouth 2 (two) times daily. Stopped on 01/12/2014      . telmisartan (MICARDIS) 80 MG tablet Take 40 mg by mouth daily.      . traMADol (ULTRAM) 50 MG tablet Take 50-100 mg by mouth 4 (four) times daily as needed. pain      . acetaminophen (TYLENOL) 500 MG tablet Take 500 mg by mouth  daily as needed for moderate pain.       Scheduled: . aspirin EC  81 mg Oral Daily  . budesonide-formoterol  2 puff Inhalation BID  . enoxaparin (LOVENOX) injection  40 mg Subcutaneous Q24H  . fluticasone  2 spray Each Nare Daily  . metoprolol  50 mg Oral Daily  . piperacillin-tazobactam (ZOSYN)  IV  3.375 g Intravenous 3 times per day  . sodium chloride  3 mL Intravenous Q12H   Continuous: . sodium chloride 1,000 mL (01/01/2014 2206)   UGQ:[id number], LORazepam, ondansetron (ZOFRAN) IV, ondansetron  Assesment: She has urinary tract infection. She was initially started on Bactrim but it turned out that her urine was not sensitive to Bactrim.  She was therefore switched to [place] which was the only oral medication to which the organism was sensitive. However she continued to have weakness and not feel well was brought to the emergency department where she was found to be hyponatremic. She's had problems with that in the past. She is known to have coronary artery occlusive disease which appears to be stable a previous pacemaker which is okay and she's had bioprosthetic aortic valve replacement Principal Problem:   Hyponatremia Active Problems:   Pacemaker [employer] Jude identity XL dual-chamber implanted 2005   S/P aortic valve replacement with bioprosthetic valve   CAD (coronary artery disease)   Hyperlipidemia   UTI (lower urinary tract infection)    Plan: Continue with IV Zosyn. She does seem to have improved. I will check sensitivities at my office and make sure that she would be sensitive to Zosyn. No other changes. Continue IV fluids et cetera    LOS: 1 day   Doninique Lwin L 12/26/2013, 8:26 AM

## 2013-12-26 NOTE — Progress Notes (Signed)
UR chart review completed.  

## 2013-12-27 LAB — BASIC METABOLIC PANEL
Anion gap: 12 (ref 5–15)
BUN: 3 mg/dL — ABNORMAL LOW (ref 6–23)
CO2: 24 meq/L (ref 19–32)
Calcium: 8.6 mg/dL (ref 8.4–10.5)
Chloride: 102 mEq/L (ref 96–112)
Creatinine, Ser: 0.62 mg/dL (ref 0.50–1.10)
GFR calc Af Amer: >90 mL/min (ref >90)
GFR calc non Af Amer: 84 mL/min — ABNORMAL LOW (ref >90)
Glucose, Bld: 104 mg/dL — ABNORMAL HIGH (ref 70–99)
Potassium: 3.5 mEq/L — ABNORMAL LOW (ref 3.7–5.3)
SODIUM: 138 meq/L (ref 137–147)

## 2013-12-27 LAB — URINE CULTURE: Colony Count: 3000

## 2013-12-27 MED ORDER — SODIUM CHLORIDE 0.9 % IV BOLUS (SEPSIS)
250.0000 mL | Freq: Once | INTRAVENOUS | Status: AC
  Administered 2013-12-27: 250 mL via INTRAVENOUS

## 2013-12-27 MED ORDER — DIAZEPAM 2 MG PO TABS
2.0000 mg | ORAL_TABLET | Freq: Four times a day (QID) | ORAL | Status: DC | PRN
  Administered 2013-12-27: 2 mg via ORAL
  Filled 2013-12-27: qty 1

## 2013-12-27 MED ORDER — ALUM & MAG HYDROXIDE-SIMETH 200-200-20 MG/5ML PO SUSP
30.0000 mL | ORAL | Status: DC | PRN
  Administered 2013-12-27 (×2): 30 mL via ORAL
  Filled 2013-12-27 (×3): qty 30

## 2013-12-27 NOTE — Progress Notes (Signed)
Subjective: She says she feels better. She wants to eat. She has no new complaints.  Objective: Vital signs in last 24 hours: Temp:  [97.2 F (36.2 C)-98.2 F (36.8 C)] 98.2 F (36.8 C) (08/04 0350) Pulse Rate:  [66-89] 89 (08/04 0350) Resp:  [18] 18 (08/04 0350) BP: (135-160)/(63-79) 135/79 mmHg (08/04 0350) SpO2:  [95 %-100 %] 99 % (08/04 0350) Weight change:  Last BM Date: 01/20/2014  Intake/Output from previous day: 08/03 0701 - 08/04 0700 In: 3537.1 [P.O.:960; I.V.:2377.1; IV Piggyback:200] Out: 3150 [Urine:3150]  PHYSICAL EXAM General appearance: alert, cooperative and mild distress Resp: clear to auscultation bilaterally Cardio: regular rate and rhythm, S1, S2 normal, no murmur, click, rub or gallop GI: soft, non-tender; bowel sounds normal; no masses,  no organomegaly Extremities: extremities normal, atraumatic, no cyanosis or edema  Lab Results:  Results for orders placed during the hospital encounter of 12/24/2013 (from the past 48 hour(s))  CBC WITH DIFFERENTIAL     Status: Abnormal   Collection Time    01/16/2014  6:32 PM      Result Value Ref Range   WBC 7.3  4.0 - 10.5 K/uL   RBC 4.56  3.87 - 5.11 MIL/uL   Hemoglobin 14.3  12.0 - 15.0 g/dL   HCT 39.1  36.0 - 46.0 %   MCV 85.7  78.0 - 100.0 fL   MCH 36.6 (*) 26.0 - 34.0 pg   MCHC 36.6 (*) 30.0 - 36.0 g/dL   RDW 13.3  11.5 - 15.5 %   Platelets 283  150 - 400 K/uL   Neutrophils Relative % 69  43 - 77 %   Neutro Abs 5.1  1.7 - 7.7 K/uL   Lymphocytes Relative 18  12 - 46 %   Lymphs Abs 1.3  0.7 - 4.0 K/uL   Monocytes Relative 12  3 - 12 %   Monocytes Absolute 0.9  0.1 - 1.0 K/uL   Eosinophils Relative 0  0 - 5 %   Eosinophils Absolute 0.0  0.0 - 0.7 K/uL   Basophils Relative 0  0 - 1 %   Basophils Absolute 0.0  0.0 - 0.1 K/uL  COMPREHENSIVE METABOLIC PANEL     Status: Abnormal   Collection Time    01/09/2014  6:32 PM      Result Value Ref Range   Sodium 121 (*) 137 - 147 mEq/L   Comment: REPEATED TO VERIFY      CRITICAL RESULT CALLED TO, READ BACK BY AND VERIFIED WITH:     MOORE,S 7:40PM ON 01/02/2014 BY MOSLEY,J   Potassium 3.9  3.7 - 5.3 mEq/L   Chloride 82 (*) 96 - 112 mEq/L   CO2 25  19 - 32 mEq/L   Glucose, Bld 102 (*) 70 - 99 mg/dL   BUN 9  6 - 23 mg/dL   Creatinine, Ser 0.79  0.50 - 1.10 mg/dL   Calcium 9.6  8.4 - 10.5 mg/dL   Total Protein 7.6  6.0 - 8.3 g/dL   Albumin 4.8  3.5 - 5.2 g/dL   AST 27  0 - 37 U/L   ALT 16  0 - 35 U/L   Alkaline Phosphatase 71  39 - 117 U/L   Total Bilirubin 0.7  0.3 - 1.2 mg/dL   GFR calc non Af Amer 77 (*) >90 mL/min   GFR calc Af Amer 89 (*) >90 mL/min   Comment: (NOTE)     The eGFR has been calculated using the CKD  EPI equation.     This calculation has not been validated in all clinical situations.     eGFR's persistently <90 mL/min signify possible Chronic Kidney     Disease.   Anion gap 14  5 - 15  LIPASE, BLOOD     Status: None   Collection Time    12/30/2013  6:32 PM      Result Value Ref Range   Lipase 55  11 - 59 U/L  PRO B NATRIURETIC PEPTIDE     Status: Abnormal   Collection Time    01/06/2014  6:32 PM      Result Value Ref Range   Pro B Natriuretic peptide (BNP) 1568.0 (*) 0 - 450 pg/mL  I-STAT TROPOININ, ED     Status: None   Collection Time    12/31/2013  6:54 PM      Result Value Ref Range   Troponin i, poc 0.02  0.00 - 0.08 ng/mL   Comment 3            Comment: Due to the release kinetics of cTnI,     a negative result within the first hours     of the onset of symptoms does not rule out     myocardial infarction with certainty.     If myocardial infarction is still suspected,     repeat the test at appropriate intervals.  I-STAT CG4 LACTIC ACID, ED     Status: None   Collection Time    12/24/2013  6:56 PM      Result Value Ref Range   Lactic Acid, Venous 0.93  0.5 - 2.2 mmol/L  URINALYSIS, ROUTINE W REFLEX MICROSCOPIC     Status: Abnormal   Collection Time    01/04/2014  7:34 PM      Result Value Ref Range   Color, Urine  YELLOW  YELLOW   APPearance CLEAR  CLEAR   Specific Gravity, Urine <1.005 (*) 1.005 - 1.030   pH 7.0  5.0 - 8.0   Glucose, UA NEGATIVE  NEGATIVE mg/dL   Hgb urine dipstick MODERATE (*) NEGATIVE   Bilirubin Urine NEGATIVE  NEGATIVE   Ketones, ur TRACE (*) NEGATIVE mg/dL   Protein, ur NEGATIVE  NEGATIVE mg/dL   Urobilinogen, UA 0.2  0.0 - 1.0 mg/dL   Nitrite NEGATIVE  NEGATIVE   Leukocytes, UA TRACE (*) NEGATIVE  URINE MICROSCOPIC-ADD ON     Status: None   Collection Time    01/05/2014  7:34 PM      Result Value Ref Range   Squamous Epithelial / LPF RARE  RARE   WBC, UA 7-10  <3 WBC/hpf   RBC / HPF 3-6  <3 RBC/hpf   Bacteria, UA RARE  RARE  OSMOLALITY     Status: Abnormal   Collection Time    01/17/2014  7:57 PM      Result Value Ref Range   Osmolality 252 (*) 275 - 300 mOsm/kg   Comment: Performed at [facility], URINE     Status: Abnormal   Collection Time    01/11/2014  8:10 PM      Result Value Ref Range   Osmolality, Ur 110 (*) 390 - 1090 mOsm/kg   Comment: Performed at [facility]  SODIUM, URINE, RANDOM     Status: None   Collection Time    01/06/2014  8:10 PM      Result Value Ref Range   Sodium, Ur 25  BASIC METABOLIC PANEL     Status: Abnormal   Collection Time    12/24/2013  9:19 PM      Result Value Ref Range   Sodium 127 (*) 137 - 147 mEq/L   Potassium 3.7  3.7 - 5.3 mEq/L   Chloride 94 (*) 96 - 112 mEq/L   Comment: DELTA CHECK NOTED   CO2 21  19 - 32 mEq/L   Glucose, Bld 100 (*) 70 - 99 mg/dL   BUN 8  6 - 23 mg/dL   Creatinine, Ser 0.69  0.50 - 1.10 mg/dL   Calcium 8.2 (*) 8.4 - 10.5 mg/dL   GFR calc non Af Amer 81 (*) >90 mL/min   GFR calc Af Amer >90  >90 mL/min   Comment: (NOTE)     The eGFR has been calculated using the CKD EPI equation.     This calculation has not been validated in all clinical situations.     eGFR's persistently <90 mL/min signify possible Chronic Kidney     Disease.   Anion gap 12  5 - 15  TROPONIN I      Status: None   Collection Time    01/18/2014  9:19 PM      Result Value Ref Range   Troponin I <0.30  <0.30 ng/mL   Comment:            Due to the release kinetics of cTnI,     a negative result within the first hours     of the onset of symptoms does not rule out     myocardial infarction with certainty.     If myocardial infarction is still suspected,     repeat the test at appropriate intervals.  CBC     Status: None   Collection Time    12/26/13  3:38 AM      Result Value Ref Range   WBC 4.9  4.0 - 10.5 K/uL   RBC 4.13  3.87 - 5.11 MIL/uL   Hemoglobin 12.5  12.0 - 15.0 g/dL   HCT 36.5  36.0 - 46.0 %   MCV 88.4  78.0 - 100.0 fL   MCH 30.3  26.0 - 34.0 pg   MCHC 34.2  30.0 - 36.0 g/dL   RDW 13.6  11.5 - 15.5 %   Platelets 241  150 - 400 K/uL  COMPREHENSIVE METABOLIC PANEL     Status: Abnormal   Collection Time    12/26/13  3:38 AM      Result Value Ref Range   Sodium 133 (*) 137 - 147 mEq/L   Potassium 4.1  3.7 - 5.3 mEq/L   Chloride 99  96 - 112 mEq/L   CO2 23  19 - 32 mEq/L   Glucose, Bld 92  70 - 99 mg/dL   BUN 7  6 - 23 mg/dL   Creatinine, Ser 0.74  0.50 - 1.10 mg/dL   Calcium 8.6  8.4 - 10.5 mg/dL   Total Protein 6.3  6.0 - 8.3 g/dL   Albumin 3.9  3.5 - 5.2 g/dL   AST 24  0 - 37 U/L   ALT 14  0 - 35 U/L   Alkaline Phosphatase 56  39 - 117 U/L   Total Bilirubin 0.6  0.3 - 1.2 mg/dL   GFR calc non Af Amer 79 (*) >90 mL/min   GFR calc Af Amer >90  >90 mL/min   Comment: (NOTE)     The eGFR  has been calculated using the CKD EPI equation.     This calculation has not been validated in all clinical situations.     eGFR's persistently <90 mL/min signify possible Chronic Kidney     Disease.   Anion gap 11  5 - 15  TROPONIN I     Status: None   Collection Time    12/26/13  3:38 AM      Result Value Ref Range   Troponin I <0.30  <0.30 ng/mL   Comment:            Due to the release kinetics of cTnI,     a negative result within the first hours     of the onset of  symptoms does not rule out     myocardial infarction with certainty.     If myocardial infarction is still suspected,     repeat the test at appropriate intervals.  TROPONIN I     Status: None   Collection Time    12/26/13 11:57 AM      Result Value Ref Range   Troponin I <0.30  <0.30 ng/mL   Comment:            Due to the release kinetics of cTnI,     a negative result within the first hours     of the onset of symptoms does not rule out     myocardial infarction with certainty.     If myocardial infarction is still suspected,     repeat the test at appropriate intervals.  BASIC METABOLIC PANEL     Status: Abnormal   Collection Time    12/27/13  5:19 AM      Result Value Ref Range   Sodium 138  137 - 147 mEq/L   Potassium 3.5 (*) 3.7 - 5.3 mEq/L   Chloride 102  96 - 112 mEq/L   CO2 24  19 - 32 mEq/L   Glucose, Bld 104 (*) 70 - 99 mg/dL   BUN 3 (*) 6 - 23 mg/dL   Creatinine, Ser 0.62  0.50 - 1.10 mg/dL   Calcium 8.6  8.4 - 10.5 mg/dL   GFR calc non Af Amer 84 (*) >90 mL/min   GFR calc Af Amer >90  >90 mL/min   Comment: (NOTE)     The eGFR has been calculated using the CKD EPI equation.     This calculation has not been validated in all clinical situations.     eGFR's persistently <90 mL/min signify possible Chronic Kidney     Disease.   Anion gap 12  5 - 15    ABGS No results found for this basename: PHART, PCO2, PO2ART, TCO2, HCO3,  in the last 72 hours CULTURES No results found for this or any previous visit (from the past 240 hour(s)). Studies/Results: Dg Chest Port 1 View  01/07/2014   CLINICAL DATA:  Weakness.  Urinary tract infection.  EXAM: PORTABLE CHEST - 1 VIEW  COMPARISON:  12/17/2013.  FINDINGS: The heart remains normal in size. No significant change in dense opacity at the left lung base. The right lung remains clear. Stable prominence of the interstitial markings. Stable median sternotomy wires and prosthetic heart valve. Stable right subclavian pacemaker  leads. Diffuse osteopenia.  IMPRESSION: 1. No significant change in dense left basilar atelectasis or pneumonia with underlying elevation of the left hemidiaphragm. 2. Stable chronic interstitial lung disease.   Electronically Signed   By: Georg Ruddle.D.  On: 01/21/2014 18:56    Medications:  Prior to Admission:  Prescriptions prior to admission  Medication Sig Dispense Refill  . aspirin EC 81 MG tablet Take 40.5 mg by mouth daily.      . budesonide-formoterol (SYMBICORT) 160-4.5 MCG/ACT inhaler Inhale 2 puffs into the lungs 2 (two) times daily.      . fluticasone (FLONASE) 50 MCG/ACT nasal spray Place 2 sprays into both nostrils daily.      Marland Kitchen LORazepam (ATIVAN) 1 MG tablet Take 1 mg by mouth 4 (four) times daily as needed for anxiety.       . metoprolol (LOPRESSOR) 50 MG tablet Take 50 mg by mouth daily.      . Multiple Vitamin (MULTIVITAMIN WITH MINERALS) TABS tablet Take 1 tablet by mouth daily.      . nitrofurantoin, macrocrystal-monohydrate, (MACROBID) 100 MG capsule Take 100 mg by mouth 2 (two) times daily. For 7 days(started on 01/06/2014)      . sulfamethoxazole-trimethoprim (BACTRIM DS) 800-160 MG per tablet Take 1 tablet by mouth 2 (two) times daily. Stopped on 01/23/2014      . telmisartan (MICARDIS) 80 MG tablet Take 40 mg by mouth daily.      . traMADol (ULTRAM) 50 MG tablet Take 50-100 mg by mouth 4 (four) times daily as needed. pain      . acetaminophen (TYLENOL) 500 MG tablet Take 500 mg by mouth daily as needed for moderate pain.       Scheduled: . aspirin EC  81 mg Oral Daily  . budesonide-formoterol  2 puff Inhalation BID  . enoxaparin (LOVENOX) injection  40 mg Subcutaneous Q24H  . fluticasone  2 spray Each Nare Daily  . metoprolol  50 mg Oral Daily  . piperacillin-tazobactam (ZOSYN)  IV  3.375 g Intravenous 3 times per day  . sodium chloride  3 mL Intravenous Q12H   Continuous: . sodium chloride 1,000 mL (12/26/13 2200)   UDJ:SHFWYOVZCHYIF, LORazepam, ondansetron  (ZOFRAN) IV, ondansetron  Assesment: She was admitted with urinary tract infection and hyponatremia probably related to poor oral intake. She has had previous hyponatremic episodes when she's been sick. She is known to have coronary artery occlusive disease which is stable and she has had a pacemaker. She has a history of anxiety and according to her daughter feels that her current medication is not working as well as it once did Principal Problem:   Hyponatremia Active Problems:   Pacemaker St. Jude identity XL dual-chamber implanted 2005   S/P aortic valve replacement with bioprosthetic valve   CAD (coronary artery disease)   Hyperlipidemia   UTI (lower urinary tract infection)    Plan: Advance her diet. I will switch her anxiety medication while she is here in the hospital to see how she responds    LOS: 2 days   Soleia Badolato L 12/27/2013, 8:45 AM

## 2013-12-27 NOTE — Progress Notes (Signed)
Patient c/o feeling bloated,some indigestion,Dr E Hawkins notified,orders received,and given ,will continue to monitor patient,family at bedside.

## 2013-12-27 NOTE — Progress Notes (Signed)
ANTIBIOTIC CONSULT NOTE - follow up Pharmacy Consult for Zosyn Indication: UTI / Sepsis  Allergies  Allergen Reactions  . Altace [Ramipril] Cough  . Ceftin [Cefuroxime Axetil] Other (See Comments)    Primary MD gave to patient for Respiratory Infection can not remember reaction.  . Crestor [Rosuvastatin]     myalgias  . Zocor [Simvastatin] Other (See Comments)    Myalgias Blurred vision    Patient Measurements: Height: 5\' 2"  (157.5 cm) Weight: 121 lb 14.6 oz (55.3 kg) IBW/kg (Calculated) : 50.1  Vital Signs: Temp: 98.5 F (36.9 C) (08/04 1405) Temp src: Oral (08/04 1405) BP: 177/72 mmHg (08/04 1405) Pulse Rate: 74 (08/04 1405) Intake/Output from previous day: 08/03 0701 - 08/04 0700 In: 3537.1 [P.O.:960; I.V.:2377.1; IV Piggyback:200] Out: 3150 [Urine:3150] Intake/Output from this shift:    Labs:  Recent Labs  12/30/2013 1832 01/23/2014 2119 12/26/13 0338 12/27/13 0519  WBC 7.3  --  4.9  --   HGB 14.3  --  12.5  --   PLT 283  --  241  --   CREATININE 0.79 0.69 0.74 0.62   Estimated Creatinine Clearance: 45.1 ml/min (by C-G formula based on Cr of 0.62). No results found for this basename: Rolm GalaVANCOTROUGH, VANCOPEAK, VANCORANDOM, GENTTROUGH, GENTPEAK, GENTRANDOM, TOBRATROUGH, TOBRAPEAK, TOBRARND, AMIKACINPEAK, AMIKACINTROU, AMIKACIN,  in the last 72 hours   Microbiology: Recent Results (from the past 720 hour(s))  URINE CULTURE     Status: None   Collection Time    12/26/2013  7:34 PM      Result Value Ref Range Status   Specimen Description URINE, CLEAN CATCH   Final   Special Requests NONE   Final   Culture  Setup Time     Final   Value: 12/26/2013 13:44     [facility]formed at [facility]   Colony Count     Final   Value: 3,000 COLONIES/ML     [facility]formed at [facility]   Culture     Final   Value: INSIGNIFICANT GROWTH     Performed at [facility]   Report Status 12/27/2013 FINAL   Final   Medical History: Past Medical History  Diagnosis  Date  . Hypertension   . Hyperlipidemia   . CAD (coronary artery disease)     CABG 01/05/04-LIMA to LAD  . Aortic stenosis     AOV relacement bovine tissue valve 01/05/04  . CHB (complete heart block)     Permanent Pacemaker 01/11/04- [employer].Jude  . Bronchitis    Medications:  Scheduled:  . aspirin EC  81 mg Oral Daily  . budesonide-formoterol  2 puff Inhalation BID  . enoxaparin (LOVENOX) injection  40 mg Subcutaneous Q24H  . fluticasone  2 spray Each Nare Daily  . metoprolol  50 mg Oral Daily  . piperacillin-tazobactam (ZOSYN)  IV  3.375 g Intravenous 3 times per day  . sodium chloride  3 mL Intravenous Q12H   Assessment: Okay for Protocol, Treatment for Sepsis/UTI.  Bactrim outpatient.  Allergies to be confirmed, ? Cephalosporins ?  Zosyn 8/2>>  Goal of Therapy:  Eradicate infection.  Plan:  Zosyn 3.375gm IV every 8 hours. Switch to PO therapy when improved / appropriate Follow-up micro data, labs, vitals.  Valrie HartHall, Peregrine Nolt A 12/27/2013,2:20 PM

## 2013-12-27 NOTE — Evaluation (Signed)
Physical Therapy Evaluation Patient Details Name: Sandra MeagerMary S May MRN: [id number] DOB: [birth year] Today's Date: 12/27/2013   History of Present Illness  80 year old female who  has a past medical history of Hypertension; Hyperlipidemia; CAD (coronary artery disease); Aortic stenosis; CHB (complete heart block); and Bronchitis.  Today was brought to the ED with several days of generalized weakness, cough, shortness of breath. Patient also developed diarrhea today with several loose bowel movements. Patient was diagnosed with UTI and was prescribed Bactrim by her PCP Dr. Juanetta GoslingHawkins on Wednesday, July 29, yesterday the culture results came back in the office called the patient to change the antibiotic to Macrobid. Today patient took one tablet of Macrobid, and has been having worsening diarrhea and weakness.  Clinical Impression  Pt is a 80 year old female who presents to PT with dx of UTI.  Pt reports onset of weakness for 2-3 weeks with regression of gait skills from std cane to std walker, and assist of bathing from daughter.   During evaluation, pt was mod (I) with bed mobility skills, and min guard and use of RW for transfers and amb skills.   Amb distance limited to 15 feet secondary to complaints of fatigue and "heaviness" of body with gait.  Recommend continued PT while in the hospital to address strengthening, activity tolerance for improvement of functional mobility skills with transition to HHPT at discharge.  Recommended to pt to continue use of RW for balance and safety with functional mobility skills.       Follow Up Recommendations Home health PT    Equipment Recommendations  None recommended by PT       Precautions / Restrictions Precautions Precautions: Fall Restrictions Weight Bearing Restrictions: No      Mobility  Bed Mobility Overal bed mobility: Modified Independent             General bed mobility comments: Increased time to complete task  Transfers Overall transfer  level: Needs assistance Equipment used: Rolling walker (2 wheeled) Transfers: Sit to/from Stand Sit to Stand: Min guard            Ambulation/Gait Ambulation/Gait assistance: Modified independent (Device/Increase time) Ambulation Distance (Feet): 15 Feet Assistive device: Rolling walker (2 wheeled) Gait Pattern/deviations: Step-through pattern;Decreased step length - right;Decreased step length - left   Gait velocity interpretation: Below normal speed for age/gender General Gait Details: "heaviness" reported in entire body after gait     Balance Overall balance assessment: Needs assistance Sitting-balance support: Feet supported;No upper extremity supported Sitting balance-Leahy Scale: Good     Standing balance support: Bilateral upper extremity supported;During functional activity Standing balance-Leahy Scale: Fair                               Pertinent Vitals/Pain No complaints of pain.     Home Living Family/patient expects to be discharged to:: Private residence Living Arrangements: Alone Available Help at Discharge: Family;Available 24 hours/day (Son lives next door, daughter lives 4-5 miles away) Type of Home: Mobile home Home Access: Stairs to enter Entrance Stairs-Rails: Can reach both Entrance Stairs-Number of Steps: 4 Home Layout: One level Home Equipment: Environmental consultantWalker - 2 wheels;Walker - standard;Cane - quad      Prior Function Level of Independence: Independent with assistive device(s);Needs assistance   Gait / Transfers Assistance Needed: Prior to onset of weakness 2-3 weeks ago, pt was mod (I) with all functional mobility skills with use of std cane.  Pt  reports she has progressed to use of std walker for mobility skills secondary to weakness.   ADL's / Homemaking Assistance Needed: Pt sponge baths, and has had assist of daughter for last 2-3 weeks secondary to weakness.            Extremity/Trunk Assessment               Lower  Extremity Assessment: Generalized weakness         Communication   Communication: No difficulties  Cognition Arousal/Alertness: Lethargic (Per family, pt was just given new antianxiety medication) Behavior During Therapy: WFL for tasks assessed/performed Overall Cognitive Status: Within Functional Limits for tasks assessed                               Assessment/Plan    PT Assessment Patient needs continued PT services  PT Diagnosis Difficulty walking;Generalized weakness   PT Problem List Decreased strength;Decreased activity tolerance;Decreased balance;Decreased mobility  PT Treatment Interventions Balance training;Gait training;DME instruction;Neuromuscular re-education;Functional mobility training;Therapeutic activities;Therapeutic exercise;Patient/family education;Stair training   PT Goals (Current goals can be found in the Care Plan section) Acute Rehab PT Goals Patient Stated Goal: get stronger PT Goal Formulation: With patient Time For Goal Achievement: 01/10/14 Potential to Achieve Goals: Good    Frequency Min 3X/week    End of Session Equipment Utilized During Treatment: Gait belt Activity Tolerance: Patient limited by fatigue Patient left: in bed;with call bell/phone within reach;with bed alarm set;with family/visitor present           Time: 1610-9604 PT Time Calculation (min): 14 min   Charges:   PT Evaluation $Initial PT Evaluation Tier I: 1 Procedure          Rock Sobol 12/27/2013, 11:44 AM

## 2013-12-28 ENCOUNTER — Inpatient Hospital Stay (HOSPITAL_COMMUNITY): Payer: Medicare Other

## 2013-12-28 MED ORDER — SODIUM CHLORIDE 0.9 % IV BOLUS (SEPSIS)
500.0000 mL | Freq: Once | INTRAVENOUS | Status: AC
  Administered 2013-12-28: 500 mL via INTRAVENOUS

## 2013-12-28 MED ORDER — SODIUM CHLORIDE 0.9 % IV BOLUS (SEPSIS): 500.0000 mL | Freq: Once | INTRAVENOUS | Status: DC

## 2013-12-31 DIAGNOSIS — J690 Pneumonitis due to inhalation of food and vomit: Secondary | ICD-10-CM | POA: Diagnosis not present

## 2013-12-31 DIAGNOSIS — R112 Nausea with vomiting, unspecified: Secondary | ICD-10-CM | POA: Diagnosis not present

## 2013-12-31 DIAGNOSIS — A419 Sepsis, unspecified organism: Secondary | ICD-10-CM | POA: Diagnosis not present

## 2013-12-31 DIAGNOSIS — R6521 Severe sepsis with septic shock: Secondary | ICD-10-CM

## 2013-12-31 NOTE — Discharge Summary (Signed)
Physician Discharge Summary  Patient ID: [name] MRN: [id number] DOB/AGE: Nov 07, 1931 80 y.o. Primary Care Physician:Marieme Mcmackin L, MD Admit date: 2014-01-01 Discharge date: 12/31/2013    Discharge Diagnoses:   Principal Problem:   Severe sepsis with septic shock Active Problems:   Pacemaker St. Jude identity XL dual-chamber implanted 2005   S/P aortic valve replacement with bioprosthetic valve   CAD (coronary artery disease)   Hyperlipidemia   Hyponatremia   UTI (lower urinary tract infection)   Nausea and vomiting   Aspiration pneumonia     Medication List    ASK your doctor about these medications       acetaminophen 500 MG tablet  Commonly known as:  TYLENOL  Take 500 mg by mouth daily as needed for moderate pain.     aspirin EC 81 MG tablet  Take 40.5 mg by mouth daily.     budesonide-formoterol 160-4.5 MCG/ACT inhaler  Commonly known as:  SYMBICORT  Inhale 2 puffs into the lungs 2 (two) times daily.     fluticasone 50 MCG/ACT nasal spray  Commonly known as:  FLONASE  Place 2 sprays into both nostrils daily.     LORazepam 1 MG tablet  Commonly known as:  ATIVAN  Take 1 mg by mouth 4 (four) times daily as needed for anxiety.     metoprolol 50 MG tablet  Commonly known as:  LOPRESSOR  Take 50 mg by mouth daily.     multivitamin with minerals Tabs tablet  Take 1 tablet by mouth daily.     nitrofurantoin (macrocrystal-monohydrate) 100 MG capsule  Commonly known as:  MACROBID  Take 100 mg by mouth 2 (two) times daily. For 7 days(started on 01/01/14)     sulfamethoxazole-trimethoprim 800-160 MG per tablet  Commonly known as:  BACTRIM DS  Take 1 tablet by mouth 2 (two) times daily. Stopped on 01-Jan-2014     telmisartan 80 MG tablet  Commonly known as:  MICARDIS  Take 40 mg by mouth daily.     traMADol 50 MG tablet  Commonly known as:  ULTRAM  Take 50-100 mg by mouth 4 (four) times daily as needed. pain        Discharged Condition:  Deceased    Consults: None  Significant Diagnostic Studies: Dg Chest Port 1 View  01/03/2014   CLINICAL DATA:  Short of breath  EXAM: PORTABLE CHEST - 1 VIEW  COMPARISON:  01/01/14  FINDINGS: Severe elevation of the left hemidiaphragm is worse. Low volumes are worse. Bibasilar opacity is worse. Normal heart size. Stable right subclavian dual lead pacemaker. No pneumothorax.  IMPRESSION: Worsening bibasilar atelectasis versus airspace disease.   Electronically Signed   By: Maryclare Bean M.D.   On: 12/24/2013 07:57   Dg Chest Port 1 View  2014/01/01   CLINICAL DATA:  Weakness.  Urinary tract infection.  EXAM: PORTABLE CHEST - 1 VIEW  COMPARISON:  12/17/2013.  FINDINGS: The heart remains normal in size. No significant change in dense opacity at the left lung base. The right lung remains clear. Stable prominence of the interstitial markings. Stable median sternotomy wires and prosthetic heart valve. Stable right subclavian pacemaker leads. Diffuse osteopenia.  IMPRESSION: 1. No significant change in dense left basilar atelectasis or pneumonia with underlying elevation of the left hemidiaphragm. 2. Stable chronic interstitial lung disease.   Electronically Signed   By: Gordan Payment M.D.   On: 01/01/14 18:56   Dg Chest Portable 1 View  12/17/2013   CLINICAL DATA:  Shortness of breath.  EXAM: PORTABLE CHEST - 1 VIEW  COMPARISON:  03/31/2013.  FINDINGS: The heart is normal in size. Stable mediastinal and hilar contours. Stable elevation of the left hemidiaphragm with overlying vascular crowding and atelectasis. Chronic underlying lung changes but no definite acute findings.  IMPRESSION: Stable elevation of the left hemidiaphragm with overlying vascular crowding and atelectasis.  No acute pulmonary findings.   Electronically Signed   By: Loralie ChampagneMark  Gallerani M.D.   On: 12/17/2013 11:59   Dg Abd Portable 2v  12/17/2013   CLINICAL DATA:  Abdominal pain.  EXAM: PORTABLE ABDOMEN - 2 VIEW  COMPARISON:  None.  FINDINGS:  There is moderate air throughout the small bowel and colon which may suggest a mild ileus or gastroenteritis. No findings for small bowel obstruction or free air. Stable aortic calcifications. The bony structures are intact.  IMPRESSION: Probable mild diffuse ileus.   Electronically Signed   By: Loralie ChampagneMark  Gallerani M.D.   On: 12/17/2013 12:00    Lab Results: Basic Metabolic Panel: No results found for this basename: NA, K, CL, CO2, GLUCOSE, BUN, CREATININE, CALCIUM, MG, PHOS,  in the last 72 hours Liver Function Tests: No results found for this basename: AST, ALT, ALKPHOS, BILITOT, PROT, ALBUMIN,  in the last 72 hours   CBC: No results found for this basename: WBC, NEUTROABS, HGB, HCT, MCV, PLT,  in the last 72 hours  Recent Results (from the past 240 hour(s))  URINE CULTURE     Status: None   Collection Time    12/31/2013  7:34 PM      Result Value Ref Range Status   Specimen Description URINE, CLEAN CATCH   Final   Special Requests NONE   Final   Culture  Setup Time     Final   Value: 12/26/2013 13:44     Performed at [facility]   Colony Count     Final   Value: 3,000 COLONIES/ML     [facility]formed at [facility]   Culture     Final   Value: INSIGNIFICANT GROWTH     Performed at [facility]   Report Status 12/27/2013 FINAL   Final     Hospital Course: This was a 80 year old who came to the hospital because of increasing problems with what appeared to be a urinary tract infection. She had been treated as an outpatient but did not respond well. She was complaining of feeling weak. She was brought to the emergency department found to be dehydrated hyponatremic and was admitted. She was started on intravenous antibiotics and given IV fluids. About 48 hours into admission she started having nausea and vomiting. She aspirated during at least one of these episodes. The next morning she appeared to be septic and in shock. Plans were made to move her to the intensive care  unit but she died before she could be moved. She had DO NOT RESUSCITATE status with plans were to give her fluids antibiotics et Karie Sodacetera.  Discharge Exam: Blood pressure 114/54, pulse 74, temperature 98.5 F (36.9 C), temperature source Oral, resp. rate 18, height 5\' 2"  (1.575 m), weight 55.3 kg (121 lb 14.6 oz), SpO2 91.00%. Not applicable  Disposition: Not applicable      Signed: Aleksander Edmiston L   12/31/2013, 8:39 AM

## 2014-01-19 NOTE — Discharge Summary (Signed)
[name], [name]                    ACCOUNT NO.:  [id number]  MEDICAL RECORD NO.:  [id number]  LOCATION:                                 FACILITY:  PHYSICIAN:  Taia Bramlett L. Juanetta Gosling, M.D.DATE OF BIRTH:  May 17, 1932  DATE OF ADMISSION:  January 18, 2014 DATE OF DISCHARGE:  08/05/2015LH                              DISCHARGE SUMMARY   ADDENDUM:  She appeared to have a urinary tract infection based on nitrate positive and elevated white blood cells and her symptoms.  She had been treated with antibiotics as an outpatient.  Her culture only grew 3000 organisms, however.  She developed severe sepsis and septic shock after admission after she had aspiration in the hospital.  She did have elevated proBNP and an abnormal electrocardiogram and was felt to have acute diastolic heart failure.  She was dehydrated on admission. So, she received Lopressor which she had been on chronically.     Jusiah Aguayo L. Juanetta Gosling, M.D.     ELH/MEDQ  D:  01/19/2014  T:  01/19/2014  Job:  [id number]

## 2014-01-24 NOTE — Progress Notes (Signed)
At approximately 2320 I was called to room 302 STAT by the nursing assistant. Upon entering the room, I assessed the pt sl[facility] with cool clammy skin. I call out to the pt; she did not respond. [facility] and [facility] on her bed where she regained consciousness. Blood pressure was 68/48 in the right arm lying. She received a 250 cc bolus, EKG, and 2L [place] per Rapid Response Team. BP increased to 101/84 at 2340. At 2350 BP was 87/56. Result called to Dr. Felecia ShellingFanta new orders received; 500 cc bolus

## 2014-01-24 NOTE — Progress Notes (Addendum)
Telemetry showed a heart rate in the 30's, paced, upon entering room patient was ashen in color, no respirations, no heart tones, heart rate on telemetry 0 at this time, no BP, Dr. Juanetta GoslingHawkins notified of condition of patient, patient pronounced expired, comfort given to the family at bedside.

## 2014-01-24 NOTE — Care Management Note (Addendum)
    Page 1 of 1   01/19/2014     11:20:36 AM CARE MANAGEMENT NOTE 01/02/2014  Patient:  [name],[name]   Account Number:  [id number]  Date Initiated:  12/26/2013  Documentation initiated by:  CHILDRESS,JESSICA  Subjective/Objective Assessment:   Patient form home, recieves assistance at home from son. Patient has shower chair,  cane and walker for PRN use.     Action/Plan:   Pt plans to discharge home. No CM needs identified at this time.   Anticipated DC Date:     Anticipated DC Plan:  HOME/SELF CARE      DC Planning Services  CM consult      Choice offered to / List presented to:             Status of service:  Completed, signed off Medicare Important Message given?   (If response is "NO", the following Medicare IM given date fields will be blank) Date Medicare IM given:   Medicare IM given by:   Date Additional Medicare IM given:   Additional Medicare IM given by:    Discharge Disposition:  EXPIRED  Per UR Regulation:    If discussed at Long Length of Stay Meetings, dates discussed:    Comments:  01/15/2014 1110 Arlyss Queenammy Roselle Norton, RN BSN CM Pt expired today.  12/26/2013 1045 Kathyrn SheriffJessica Childress, RN, MSN, [employer] [employer]

## 2014-01-24 NOTE — Progress Notes (Signed)
Called to patients room by family member, patient sitting upright in bed with Oxygen 2L/[place], audible crackles, tachypnea, oxygen saturation 73%, 100%NRB mask applied, attempted oral suction with no results, oxygen saturations increased to 93%, MD aware.

## 2014-01-24 NOTE — Progress Notes (Signed)
Present with family for spiritual support due to patient's death.

## 2014-01-24 NOTE — Progress Notes (Signed)
Dr. Juanetta GoslingHawkins notified regarding Pt's wet breath sounds after HS bolus totaling 1250 cc. Stat chest xray ordered.

## 2014-01-24 NOTE — Progress Notes (Signed)
Subjective: She had significant problems last night with vomiting. Her blood pressure dropped. Her family feels certain that she aspirated. He she's not very responsive this morning. She has DO NOT RESUSCITATE status but they do want everything short of resuscitation done. We discussed that again today.  Objective: Vital signs in last 24 hours: Temp:  [98.5 F (36.9 C)] 98.5 F (36.9 C) (08/04 1405) Pulse Rate:  [74] 74 (08/04 1405) Resp:  [18] 18 (08/04 1405) BP: (68-177)/(41-84) 114/54 mmHg (08/05 0214) SpO2:  [91 %-100 %] 91 % (08/05 0214) Weight change:  Last BM Date: 12/27/13  Intake/Output from previous day: 08/04 0701 - 08/05 0700 In: 3655 [P.O.:480; I.V.:3125; IV Piggyback:50] Out: 900 [Urine:900]  PHYSICAL EXAM General appearance: cooperative and mild distress Resp: rhonchi bilaterally Cardio: regular rate and rhythm, S1, S2 normal, no murmur, click, rub or gallop GI: soft, non-tender; bowel sounds normal; no masses,  no organomegaly Extremities: extremities normal, atraumatic, no cyanosis or edema  Lab Results:  Results for orders placed during the hospital encounter of 01/05/2014 (from the past 48 hour(s))  TROPONIN I     Status: None   Collection Time    12/26/13 11:57 AM      Result Value Ref Range   Troponin I <0.30  <0.30 ng/mL   Comment:            Due to the release kinetics of cTnI,     a negative result within the first hours     of the onset of symptoms does not rule out     myocardial infarction with certainty.     If myocardial infarction is still suspected,     repeat the test at appropriate intervals.  BASIC METABOLIC PANEL     Status: Abnormal   Collection Time    12/27/13  5:19 AM      Result Value Ref Range   Sodium 138  137 - 147 mEq/L   Potassium 3.5 (*) 3.7 - 5.3 mEq/L   Chloride 102  96 - 112 mEq/L   CO2 24  19 - 32 mEq/L   Glucose, Bld 104 (*) 70 - 99 mg/dL   BUN 3 (*) 6 - 23 mg/dL   Creatinine, Ser 0.62  0.50 - 1.10 mg/dL   Calcium  8.6  8.4 - 10.5 mg/dL   GFR calc non Af Amer 84 (*) >90 mL/min   GFR calc Af Amer >90  >90 mL/min   Comment: (NOTE)     The eGFR has been calculated using the CKD EPI equation.     This calculation has not been validated in all clinical situations.     eGFR's persistently <90 mL/min signify possible Chronic Kidney     Disease.   Anion gap 12  5 - 15    ABGS No results found for this basename: PHART, PCO2, PO2ART, TCO2, HCO3,  in the last 72 hours CULTURES Recent Results (from the past 240 hour(s))  URINE CULTURE     Status: None   Collection Time    01/14/2014  7:34 PM      Result Value Ref Range Status   Specimen Description URINE, CLEAN CATCH   Final   Special Requests NONE   Final   Culture  Setup Time     Final   Value: 12/26/2013 13:44     Performed at [place]     Final   Value: 3,000 COLONIES/ML     Performed at  [facility]     Final   Value: INSIGNIFICANT GROWTH     Performed at [facility]   Report Status 12/27/2013 FINAL   Final   Studies/Results: Dg Chest Port 1 View  2014-01-08   CLINICAL DATA:  Short of breath  EXAM: PORTABLE CHEST - 1 VIEW  COMPARISON:  01/23/2014  FINDINGS: Severe elevation of the left hemidiaphragm is worse. Low volumes are worse. Bibasilar opacity is worse. Normal heart size. Stable right subclavian dual lead pacemaker. No pneumothorax.  IMPRESSION: Worsening bibasilar atelectasis versus airspace disease.   Electronically Signed   By: Maryclare Bean M.D.   On: 01/08/2014 07:57    Medications:  Prior to Admission:  Prescriptions prior to admission  Medication Sig Dispense Refill  . aspirin EC 81 MG tablet Take 40.5 mg by mouth daily.      . budesonide-formoterol (SYMBICORT) 160-4.5 MCG/ACT inhaler Inhale 2 puffs into the lungs 2 (two) times daily.      . fluticasone (FLONASE) 50 MCG/ACT nasal spray Place 2 sprays into both nostrils daily.      Marland Kitchen LORazepam (ATIVAN) 1 MG tablet Take 1 mg by mouth  4 (four) times daily as needed for anxiety.       . metoprolol (LOPRESSOR) 50 MG tablet Take 50 mg by mouth daily.      . Multiple Vitamin (MULTIVITAMIN WITH MINERALS) TABS tablet Take 1 tablet by mouth daily.      . nitrofurantoin, macrocrystal-monohydrate, (MACROBID) 100 MG capsule Take 100 mg by mouth 2 (two) times daily. For 7 days(started on 01/11/2014)      . sulfamethoxazole-trimethoprim (BACTRIM DS) 800-160 MG per tablet Take 1 tablet by mouth 2 (two) times daily. Stopped on 01/02/2014      . telmisartan (MICARDIS) 80 MG tablet Take 40 mg by mouth daily.      . traMADol (ULTRAM) 50 MG tablet Take 50-100 mg by mouth 4 (four) times daily as needed. pain      . acetaminophen (TYLENOL) 500 MG tablet Take 500 mg by mouth daily as needed for moderate pain.       Scheduled: . aspirin EC  81 mg Oral Daily  . budesonide-formoterol  2 puff Inhalation BID  . enoxaparin (LOVENOX) injection  40 mg Subcutaneous Q24H  . fluticasone  2 spray Each Nare Daily  . metoprolol  50 mg Oral Daily  . piperacillin-tazobactam (ZOSYN)  IV  3.375 g Intravenous 3 times per day  . sodium chloride  500 mL Intravenous Once  . sodium chloride  3 mL Intravenous Q12H   Continuous: . sodium chloride 1,000 mL (12/26/13 2200)   YQI:[id number], alum & mag hydroxide-simeth, diazepam, ondansetron (ZOFRAN) IV, ondansetron  Assesment: She was admitted with urinary tract infection. She has had hyponatremia which has mostly resolved. She had problems with nausea vomiting and potential aspiration last night. She became hypotensive and had fluid bolus but does not appear to be volume overloaded. She has what may be some pneumonia on the right. She has a prominent gastric bubble on chest x-ray. Principal Problem:   Hyponatremia Active Problems:   Pacemaker St. Jude identity XL dual-chamber implanted 2005   S/P aortic valve replacement with bioprosthetic valve   CAD (coronary artery disease)   Hyperlipidemia   UTI (lower urinary  tract infection)    Plan: Transfer to ICU. She will need fluid resuscitation. Modify her antibiotics. I think she needs a nasogastric or orogastric tube. She will not have full  resuscitative efforts.    LOS: 3 days   Keoni Risinger L 01-19-2014, 8:46 AM

## 2014-01-24 DEATH — deceased

## 2014-02-20 ENCOUNTER — Ambulatory Visit: Payer: Medicare Other | Admitting: Cardiovascular Disease

## 2015-04-06 IMAGING — CR DG ABD PORTABLE 2V
1 series · 2 of 2 positions shown · non-contrast
Comparison: None.

CLINICAL DATA: Abdominal pain.

EXAM:
PORTABLE ABDOMEN - 2 VIEW

[Series 1: supine ap · 0.17mm/px · 2 of 2 slices shown]
[im 1/2]
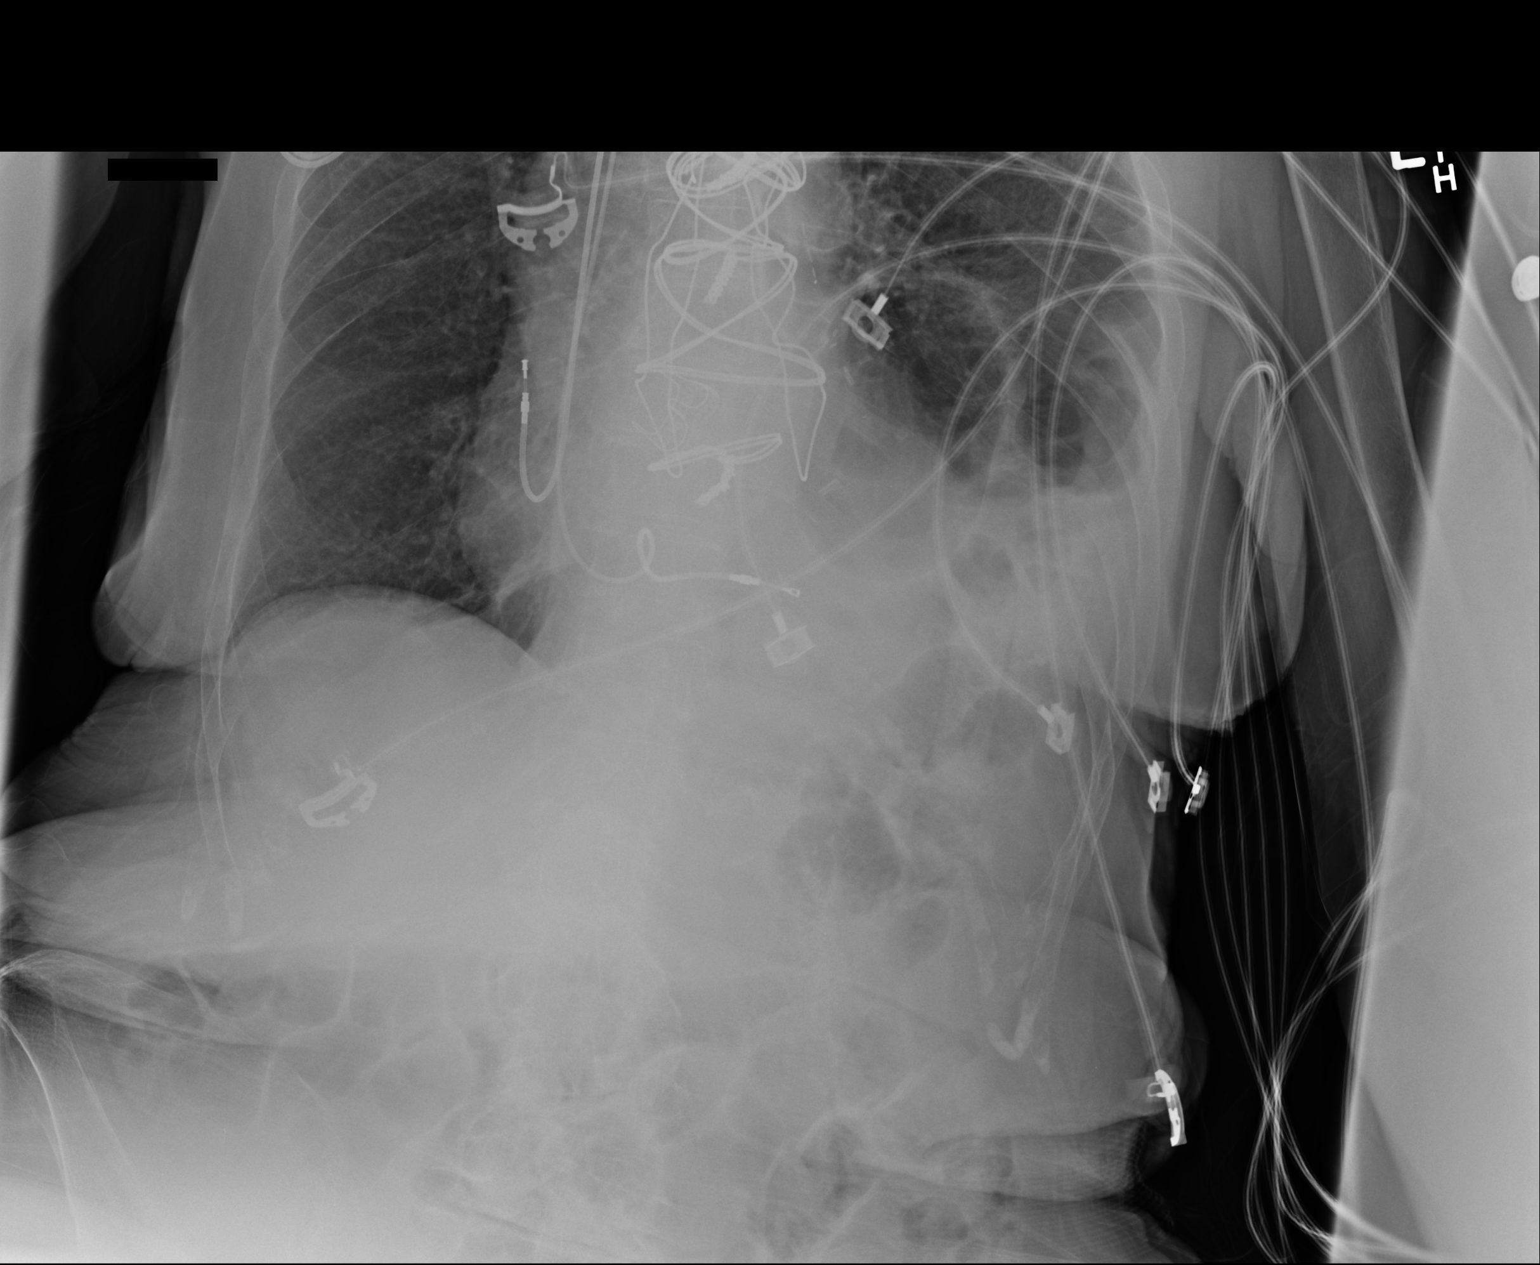
[im 2/2]
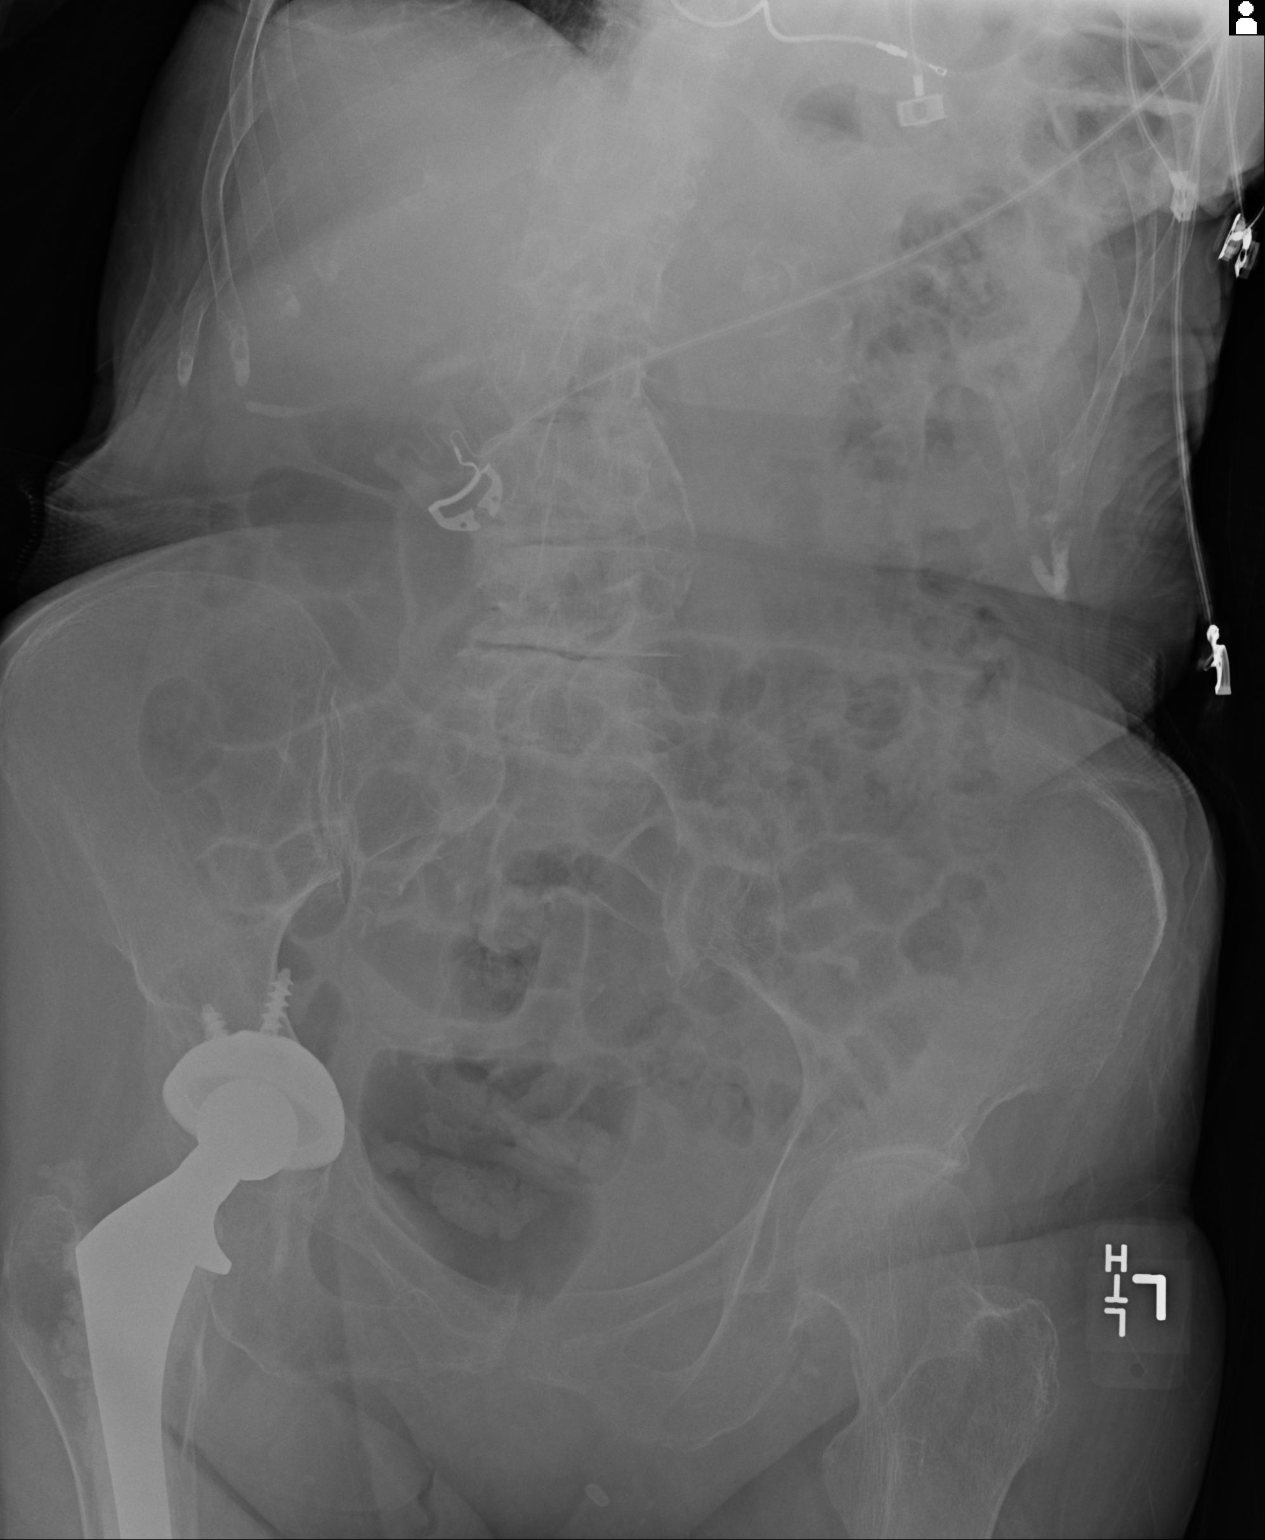

[2 of 2 positions shown; findings below may reference images not displayed]

FINDINGS: There is moderate air throughout the small bowel and colon which may
suggest a mild ileus or gastroenteritis. No findings for small bowel
obstruction or free air. Stable aortic calcifications. The bony
structures are intact.
IMPRESSION: Probable mild diffuse ileus.

## 2015-04-14 IMAGING — CR DG CHEST 1V PORT
1 series · 1 of 1 positions shown · non-contrast
Comparison: 12/17/2013.

CLINICAL DATA: Weakness.  Urinary tract infection.

EXAM:
PORTABLE CHEST - 1 VIEW

[portable]
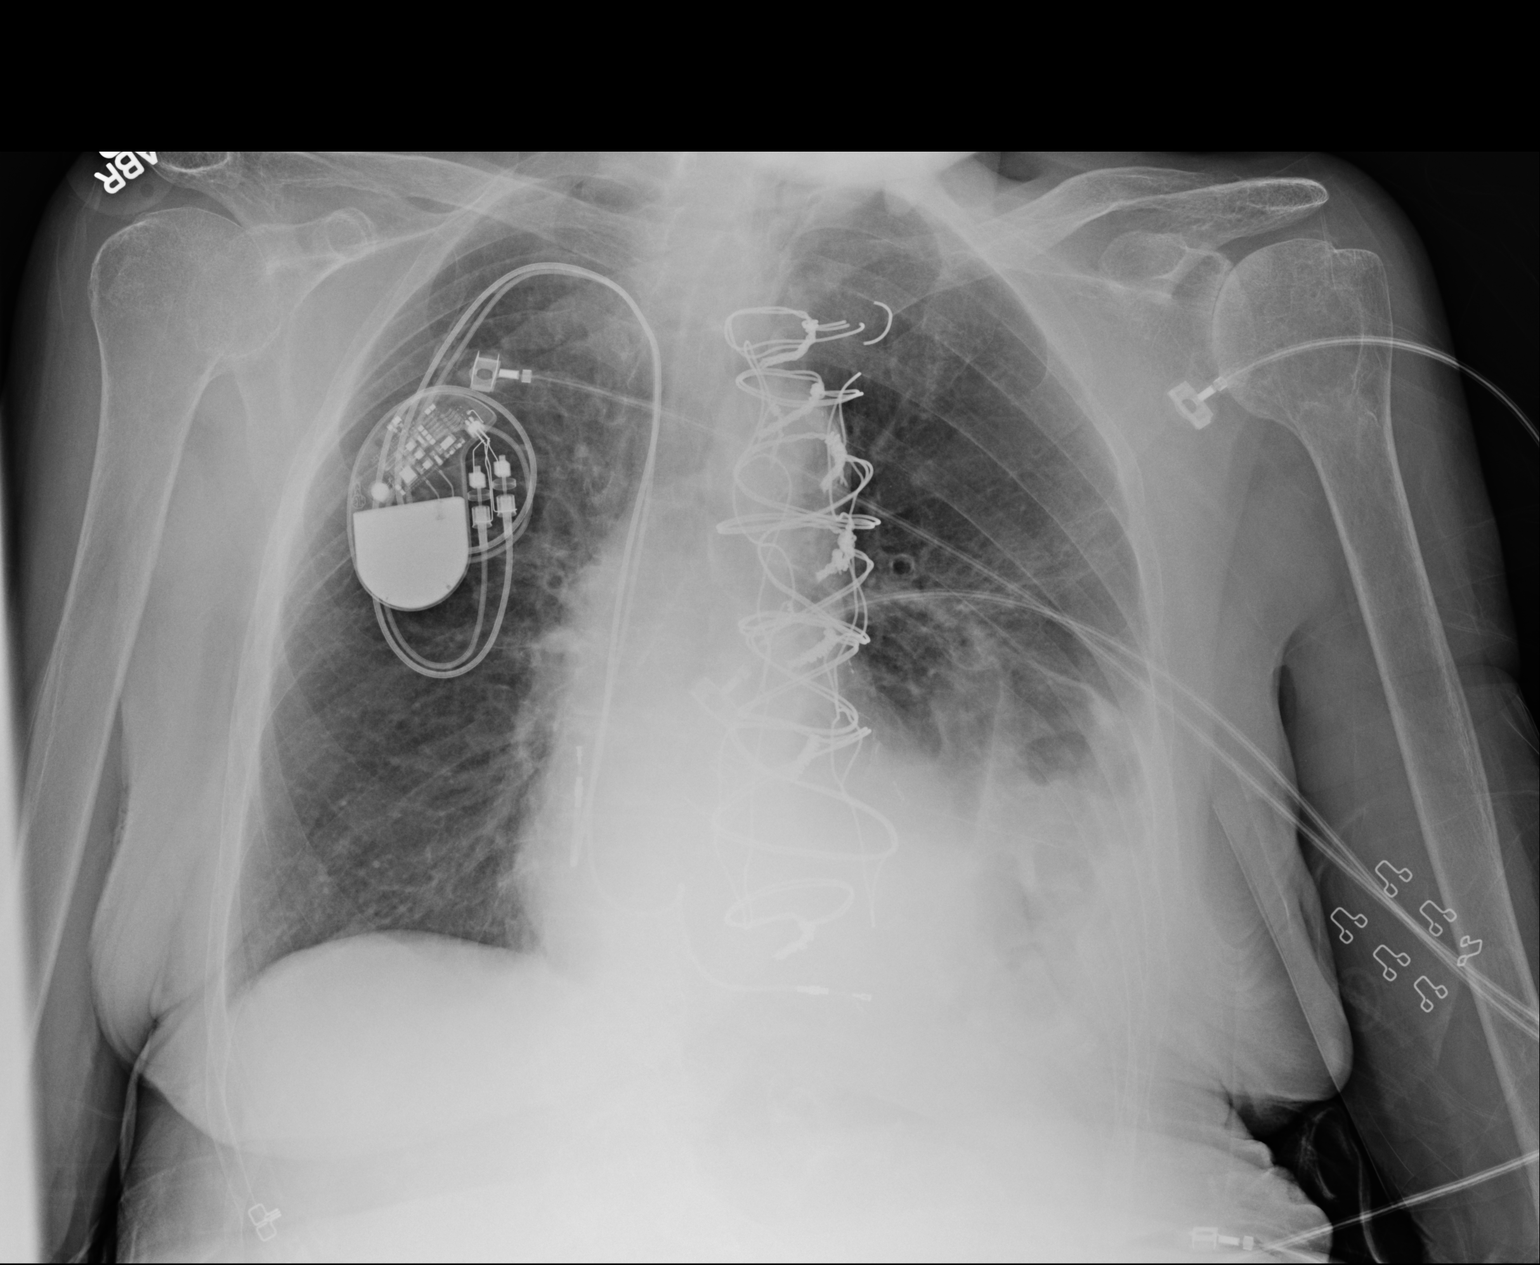

[1 of 1 positions shown; findings below may reference images not displayed]

FINDINGS: The heart remains normal in size. No significant change in dense
opacity at the left lung base. The right lung remains clear. Stable
prominence of the interstitial markings. Stable median sternotomy
wires and prosthetic heart valve. Stable right subclavian pacemaker
leads. Diffuse osteopenia.
IMPRESSION: 1. No significant change in dense left basilar atelectasis or
pneumonia with underlying elevation of the left hemidiaphragm.
2. Stable chronic interstitial lung disease.
